# Patient Record
Sex: Male | Born: 1975 | Race: White | Hispanic: No | Marital: Single | Smoking: Current every day smoker
Health system: Southern US, Community
[De-identification: ages and names within clinical notes are randomized; demographics above are authoritative.]

## PROBLEM LIST (undated history)

## (undated) DIAGNOSIS — C3490 Malignant neoplasm of unspecified part of unspecified bronchus or lung: Secondary | ICD-10-CM

## (undated) DIAGNOSIS — F419 Anxiety disorder, unspecified: Secondary | ICD-10-CM

## (undated) DIAGNOSIS — N200 Calculus of kidney: Secondary | ICD-10-CM

## (undated) DIAGNOSIS — J449 Chronic obstructive pulmonary disease, unspecified: Secondary | ICD-10-CM

## (undated) HISTORY — PX: KNEE SURGERY: SHX244

## (undated) HISTORY — DX: Chronic obstructive pulmonary disease, unspecified: J44.9

## (undated) HISTORY — PX: ELBOW SURGERY: SHX618

---

## 2002-12-06 ENCOUNTER — Emergency Department (HOSPITAL_COMMUNITY): Admission: AD | Admit: 2002-12-06 | Discharge: 2002-12-06 | Payer: Self-pay | Admitting: Family Medicine

## 2004-02-24 ENCOUNTER — Emergency Department (HOSPITAL_COMMUNITY): Admission: EM | Admit: 2004-02-24 | Discharge: 2004-02-24 | Payer: Self-pay | Admitting: Emergency Medicine

## 2004-02-25 ENCOUNTER — Ambulatory Visit (HOSPITAL_COMMUNITY): Admission: RE | Admit: 2004-02-25 | Discharge: 2004-02-25 | Payer: Self-pay | Admitting: Emergency Medicine

## 2004-12-22 ENCOUNTER — Emergency Department (HOSPITAL_COMMUNITY): Admission: EM | Admit: 2004-12-22 | Discharge: 2004-12-22 | Payer: Self-pay | Admitting: Emergency Medicine

## 2005-09-22 ENCOUNTER — Encounter: Payer: Self-pay | Admitting: Orthopedic Surgery

## 2005-10-16 ENCOUNTER — Encounter: Payer: Self-pay | Admitting: Orthopedic Surgery

## 2005-11-15 ENCOUNTER — Encounter: Payer: Self-pay | Admitting: Orthopedic Surgery

## 2007-10-13 ENCOUNTER — Ambulatory Visit (HOSPITAL_COMMUNITY): Admission: RE | Admit: 2007-10-13 | Discharge: 2007-10-13 | Payer: Self-pay | Admitting: Family Medicine

## 2007-10-19 ENCOUNTER — Ambulatory Visit (HOSPITAL_COMMUNITY): Admission: RE | Admit: 2007-10-19 | Discharge: 2007-10-19 | Payer: Self-pay | Admitting: Family Medicine

## 2007-10-24 ENCOUNTER — Emergency Department (HOSPITAL_COMMUNITY): Admission: EM | Admit: 2007-10-24 | Discharge: 2007-10-24 | Payer: Self-pay | Admitting: Emergency Medicine

## 2007-11-01 ENCOUNTER — Ambulatory Visit: Payer: Self-pay | Admitting: Unknown Physician Specialty

## 2008-03-01 ENCOUNTER — Ambulatory Visit: Payer: Self-pay | Admitting: Unknown Physician Specialty

## 2008-03-06 ENCOUNTER — Ambulatory Visit: Payer: Self-pay | Admitting: Unknown Physician Specialty

## 2008-05-23 ENCOUNTER — Ambulatory Visit: Payer: Self-pay | Admitting: Unknown Physician Specialty

## 2009-04-29 ENCOUNTER — Ambulatory Visit: Payer: Self-pay | Admitting: Neurology

## 2009-11-11 ENCOUNTER — Ambulatory Visit: Payer: Self-pay | Admitting: Internal Medicine

## 2010-05-04 ENCOUNTER — Ambulatory Visit: Payer: Self-pay | Admitting: Internal Medicine

## 2010-10-16 ENCOUNTER — Ambulatory Visit: Payer: Self-pay | Admitting: Internal Medicine

## 2010-11-06 ENCOUNTER — Ambulatory Visit: Payer: Self-pay | Admitting: Internal Medicine

## 2011-07-01 ENCOUNTER — Emergency Department (HOSPITAL_COMMUNITY): Payer: Medicaid Other

## 2011-07-01 ENCOUNTER — Encounter (HOSPITAL_COMMUNITY): Payer: Self-pay | Admitting: Emergency Medicine

## 2011-07-01 ENCOUNTER — Emergency Department (HOSPITAL_COMMUNITY)
Admission: EM | Admit: 2011-07-01 | Discharge: 2011-07-01 | Disposition: A | Discharge status: Home / Self Care | Payer: Medicaid Other | Attending: Emergency Medicine | Admitting: Emergency Medicine

## 2011-07-01 DIAGNOSIS — R319 Hematuria, unspecified: Secondary | ICD-10-CM | POA: Insufficient documentation

## 2011-07-01 DIAGNOSIS — F17200 Nicotine dependence, unspecified, uncomplicated: Secondary | ICD-10-CM | POA: Insufficient documentation

## 2011-07-01 DIAGNOSIS — R109 Unspecified abdominal pain: Secondary | ICD-10-CM | POA: Insufficient documentation

## 2011-07-01 DIAGNOSIS — M545 Low back pain, unspecified: Secondary | ICD-10-CM | POA: Insufficient documentation

## 2011-07-01 HISTORY — DX: Anxiety disorder, unspecified: F41.9

## 2011-07-01 LAB — URINALYSIS, ROUTINE W REFLEX MICROSCOPIC
Bilirubin Urine: NEGATIVE
Ketones, ur: NEGATIVE mg/dL
Leukocytes, UA: NEGATIVE
Nitrite: NEGATIVE
Specific Gravity, Urine: 1.005 (ref 1.005–1.030)
Urobilinogen, UA: 0.2 mg/dL (ref 0.0–1.0)
pH: 6 (ref 5.0–8.0)

## 2011-07-01 MED ORDER — OXYCODONE-ACETAMINOPHEN 5-325 MG PO TABS: 1.0000 | ORAL_TABLET | ORAL | Status: AC | PRN

## 2011-07-01 MED ORDER — HYDROMORPHONE HCL PF 2 MG/ML IJ SOLN
2.0000 mg | Freq: Once | INTRAMUSCULAR | Status: AC
  Administered 2011-07-01: 2 mg via INTRAVENOUS
  Filled 2011-07-01: qty 1

## 2011-07-01 MED ORDER — CYCLOBENZAPRINE HCL 10 MG PO TABS: 10.0000 mg | ORAL_TABLET | Freq: Three times a day (TID) | ORAL | Status: AC | PRN

## 2011-07-01 MED ORDER — SODIUM CHLORIDE 0.9 % IV SOLN
INTRAVENOUS | Status: DC
  Administered 2011-07-01: 13:00:00 via INTRAVENOUS

## 2011-07-01 MED ORDER — ONDANSETRON HCL 4 MG/2ML IJ SOLN
4.0000 mg | Freq: Once | INTRAMUSCULAR | Status: AC
  Administered 2011-07-01: 4 mg via INTRAVENOUS
  Filled 2011-07-01: qty 2

## 2011-07-01 MED ORDER — DIPHENHYDRAMINE HCL 50 MG/ML IJ SOLN
25.0000 mg | Freq: Once | INTRAMUSCULAR | Status: AC
  Administered 2011-07-01: 25 mg via INTRAVENOUS
  Filled 2011-07-01: qty 1

## 2011-07-01 NOTE — ED Notes (Signed)
Severe pain to lower left abd and flank area. Sometimes more severe than others. Pt states passing some blood in urine a few times recently. No history of kidney stones.

## 2011-07-01 NOTE — ED Provider Notes (Cosign Needed)
History  This chart was scribed for Guy Cooper III, MD by Stevphen Meuse. This patient was seen in room APA03/APA03 and the patient's care was started at 12.35PM.  CSN: [id number]  Arrival date & time 07/01/11  1110   First MD Initiated Contact with Patient 07/01/11 1230      Chief Complaint  Patient presents with  . Flank Pain  . Hematuria    (Consider location/radiation/quality/duration/timing/severity/associated sxs/prior treatment) Patient is a 36 y.o. male presenting with flank pain and hematuria. The history is provided by the patient. No language interpreter was used.  Flank Pain The current episode started more than 2 days ago. The problem has been gradually worsening. Pertinent negatives include no chest pain, no headaches and no shortness of breath. He has tried nothing for the symptoms.  Hematuria Associated symptoms include flank pain. Pertinent negatives include no fever.   Guy Miller is a 36 y.o. male who presents to the Emergency Department complaining of approximately 1 week of gradual onset, gradually worsening severe flank pain radiating to his lower left abdomin. Pt states that he has had similar episodes but states that they normally go away on its own. Pt states that movement aggrevates his pain. Pt denies taking any medication for his current symptoms. Pt denies fever, neck pain, SOB, chest pain, diarrhea, rash and HA as associated symptoms. Pt reports hematuria as an associated symptom. Pt has a h/o anxiety and is a current everyday smoker but denies a h/o alcohol use.  Past Medical History  Diagnosis Date  . Anxiety     No past surgical history on file.  No family history on file.  History  Substance Use Topics  . Smoking status: Current Everyday Smoker -- 2.0 packs/day  . Smokeless tobacco: Not on file  . Alcohol Use: No      Review of Systems  Constitutional: Negative for fever.  HENT: Negative for neck pain.   Respiratory: Negative  for shortness of breath.   Cardiovascular: Negative for chest pain.  Gastrointestinal: Negative for diarrhea.  Genitourinary: Positive for hematuria and flank pain.  Skin: Negative for rash.  Neurological: Negative for headaches.    Allergies  Review of patient's allergies indicates no known allergies.  Home Medications  No current outpatient prescriptions on file.  Triage Vitals: BP 121/60  Pulse 79  Temp(Src) 97.7 F (36.5 C) (Oral)  Resp 20  Ht 5\' 9"  (1.753 m)  Wt 180 lb (81.647 kg)  BMI 26.58 kg/m2  SpO2 100%  Physical Exam  Nursing note and vitals reviewed. Constitutional: He is oriented to person, place, and time. He appears well-developed and well-nourished.  HENT:  Head: Normocephalic and atraumatic.  Eyes: Conjunctivae are normal.  Neck: Normal range of motion. Neck supple.  Cardiovascular: Normal rate, regular rhythm and normal heart sounds.   Pulmonary/Chest: Effort normal and breath sounds normal.  Abdominal: Soft. Bowel sounds are normal.  Musculoskeletal:       Left flank and left lower abdomen pain  Neurological: He is alert and oriented to person, place, and time.  Skin: Skin is warm and dry.  Psychiatric: He has a normal mood and affect. His behavior is normal.    ED Course  Procedures (including critical care time) DIAGNOSTIC STUDIES: Oxygen Saturation is 100% on room air, normal by my interpretation.    COORDINATION OF CARE: 12:41PM discussed a urine test and xray of body to check for kidney stones with pt and pt agreed     Labs Reviewed  URINALYSIS, ROUTINE W REFLEX MICROSCOPIC   3:01 PM Results for orders placed during the hospital encounter of 07/01/11  URINALYSIS, ROUTINE W REFLEX MICROSCOPIC      Component Value Range   Color, Urine YELLOW  YELLOW    APPearance CLEAR  CLEAR    Specific Gravity, Urine 1.005  1.005 - 1.030    pH 6.0  5.0 - 8.0    Glucose, UA NEGATIVE  NEGATIVE (mg/dL)   Hgb urine dipstick NEGATIVE  NEGATIVE     Bilirubin Urine NEGATIVE  NEGATIVE    Ketones, ur NEGATIVE  NEGATIVE (mg/dL)   Protein, ur NEGATIVE  NEGATIVE (mg/dL)   Urobilinogen, UA 0.2  0.0 - 1.0 (mg/dL)   Nitrite NEGATIVE  NEGATIVE    Leukocytes, UA NEGATIVE  NEGATIVE    Ct Abdomen Pelvis Wo Contrast  07/01/2011  *RADIOLOGY REPORT*  Clinical Data: Left flank pain and hematuria extending to left groin, suspected kidney stone  CT ABDOMEN AND PELVIS WITHOUT CONTRAST  Technique:  Multidetector CT imaging of the abdomen and pelvis was performed following the standard protocol without intravenous contrast. Sagittal and coronal MPR images reconstructed from axial data set.  Comparison: None  Findings: Minimal atelectasis lateral right lung base. No urinary tract calcification, hydronephrosis or hydroureter. Kidneys and bladder normal appearance. Tiny capsular calcification lateral margin of the liver, nonspecific. Within limits of a nonenhanced exam, no focal abnormalities of the liver, spleen, pancreas, kidneys, or adrenal glands otherwise identified. Normal appendix. Stomach and bowel loops normal appearance. No mass, adenopathy, free fluid inflammatory process. No acute osseous findings.  IMPRESSION: No acute intra abdominal or intrapelvic abnormalities.  Original Report Authenticated By: Lollie Marrow, M.D.   UA and CT of abdomen and pelvis were negative.  No specific cause for his back pain was found.  Rx as for back pain with Flexeril and Percocet.  F/U with his doctor at the [facility].      1. Low back pain    I personally performed the services described in this documentation, which was scribed in my presence. The recorded information has been reviewed and considered.  Osvaldo Human, M.D.     Guy Cooper III, MD 07/01/11 [phone]

## 2011-07-01 NOTE — Discharge Instructions (Signed)
Back Pain, Adult Low back pain is very common. About 1 in 5 people have back pain.The cause of low back pain is rarely dangerous. The pain often gets better over time.About half of people with a sudden onset of back pain feel better in just 2 weeks. About 8 in 10 people feel better by 6 weeks.  CAUSES Some common causes of back pain include:  Strain of the muscles or ligaments supporting the spine.   Wear and tear (degeneration) of the spinal discs.   Arthritis.   Direct injury to the back.  DIAGNOSIS Most of the time, the direct cause of low back pain is not known.However, back pain can be treated effectively even when the exact cause of the pain is unknown.Answering your caregiver's questions about your overall health and symptoms is one of the most accurate ways to make sure the cause of your pain is not dangerous. If your caregiver needs more information, he or she may order lab work or imaging tests (X-rays or MRIs).However, even if imaging tests show changes in your back, this usually does not require surgery. HOME CARE INSTRUCTIONS For many people, back pain returns.Since low back pain is rarely dangerous, it is often a condition that people can learn to manageon their own.   Remain active. It is stressful on the back to sit or stand in one place. Do not sit, drive, or stand in one place for more than 30 minutes at a time. Take short walks on level surfaces as soon as pain allows.Try to increase the length of time you walk each day.   Do not stay in bed.Resting more than 1 or 2 days can delay your recovery.   Do not avoid exercise or work.Your body is made to move.It is not dangerous to be active, even though your back may hurt.Your back will likely heal faster if you return to being active before your pain is gone.   Pay attention to your body when you bend and lift. Many people have less discomfortwhen lifting if they bend their knees, keep the load close to their  bodies,and avoid twisting. Often, the most comfortable positions are those that put less stress on your recovering back.   Find a comfortable position to sleep. Use a firm mattress and lie on your side with your knees slightly bent. If you lie on your back, put a pillow under your knees.   Only take over-the-counter or prescription medicines as directed by your caregiver. Over-the-counter medicines to reduce pain and inflammation are often the most helpful.Your caregiver may prescribe muscle relaxant drugs.These medicines help dull your pain so you can more quickly return to your normal activities and healthy exercise.   Put ice on the injured area.   Put ice in a plastic bag.   Place a towel between your skin and the bag.   Leave the ice on for 15 to 20 minutes, 3 to 4 times a day for the first 2 to 3 days. After that, ice and heat may be alternated to reduce pain and spasms.   Ask your caregiver about trying back exercises and gentle massage. This may be of some benefit.   Avoid feeling anxious or stressed.Stress increases muscle tension and can worsen back pain.It is important to recognize when you are anxious or stressed and learn ways to manage it.Exercise is a great option.  SEEK MEDICAL CARE IF:  You have pain that is not relieved with rest or medicine.   You have   pain that does not improve in 1 week.   You have new symptoms.   You are generally not feeling well.  SEEK IMMEDIATE MEDICAL CARE IF:   You have pain that radiates from your back into your legs.   You develop new bowel or bladder control problems.   You have unusual weakness or numbness in your arms or legs.   You develop nausea or vomiting.   You develop abdominal pain.   You feel faint.  Document Released: 02/01/2005 Document Revised: 01/21/2011 Document Reviewed: 06/22/2010 [employer] Patient Information 2012 [employer]. 

## 2012-03-24 ENCOUNTER — Other Ambulatory Visit: Payer: Self-pay | Admitting: Urology

## 2012-03-24 ENCOUNTER — Ambulatory Visit (INDEPENDENT_AMBULATORY_CARE_PROVIDER_SITE_OTHER): Payer: Medicaid Other | Admitting: Urology

## 2012-03-24 DIAGNOSIS — M545 Low back pain, unspecified: Secondary | ICD-10-CM

## 2012-03-24 DIAGNOSIS — R310 Gross hematuria: Secondary | ICD-10-CM

## 2012-03-24 DIAGNOSIS — R1032 Left lower quadrant pain: Secondary | ICD-10-CM

## 2012-03-27 ENCOUNTER — Ambulatory Visit (HOSPITAL_COMMUNITY): Payer: Medicaid Other

## 2012-03-30 ENCOUNTER — Ambulatory Visit (HOSPITAL_COMMUNITY): Payer: Medicaid Other

## 2012-04-07 ENCOUNTER — Ambulatory Visit: Payer: Medicaid Other | Admitting: Urology

## 2012-04-13 ENCOUNTER — Encounter: Payer: Self-pay | Admitting: Orthopedic Surgery

## 2012-04-13 ENCOUNTER — Ambulatory Visit (INDEPENDENT_AMBULATORY_CARE_PROVIDER_SITE_OTHER): Payer: Medicaid Other

## 2012-04-13 ENCOUNTER — Ambulatory Visit (INDEPENDENT_AMBULATORY_CARE_PROVIDER_SITE_OTHER): Payer: Medicaid Other | Admitting: Orthopedic Surgery

## 2012-04-13 VITALS — BP 102/80 | Ht 69.0 in | Wt 172.0 lb

## 2012-04-13 DIAGNOSIS — M24559 Contracture, unspecified hip: Secondary | ICD-10-CM

## 2012-04-13 DIAGNOSIS — M5126 Other intervertebral disc displacement, lumbar region: Secondary | ICD-10-CM

## 2012-04-13 DIAGNOSIS — M25569 Pain in unspecified knee: Secondary | ICD-10-CM

## 2012-04-13 DIAGNOSIS — Z9889 Other specified postprocedural states: Secondary | ICD-10-CM

## 2012-04-13 DIAGNOSIS — M24551 Contracture, right hip: Secondary | ICD-10-CM | POA: Insufficient documentation

## 2012-04-13 NOTE — Progress Notes (Signed)
Patient ID: Guy Miller, male   DOB: 03-27-1975, 37 y.o.   MRN: [id number] Chief Complaint  Patient presents with  . Knee Pain    Right knee pain.    Chief complaint pain in the right knee radiating into the right foot with numbness and tingling associated with back pain radiating from the right hip down into the right foot. Secondary complaint I can't turn my right leg and.  The patient had a right knee anterior cruciate ligament reconstruction with Endobutton, interference screw on the tibia and a screw and washer also on the tibia as backup fixation. He did well until 6 months ago when he started having back pain radiating down to his knee and foot associated plantar numbness.  In December his father was falling he went to catch him he externally rotated his foot and since that time has not been able to bring it back to neutral position.  He complains of sharp throbbing 10 out of 10 constant pain with numbness and tingling associated with swelling in his right knee  Review of systems chills wording of the shortness of breath wheezing cough tightness heartburn nausea vomiting numbness tingling nervousness anxiety excessive thirst he denies easy bleeding adverse reactions to food frequency urgency chest palpitations or chest pain  Past Medical History  Diagnosis Date  . Anxiety   . COPD (chronic obstructive pulmonary disease)   . Anxiety     BP 102/80  Ht 5\' 9"  (1.753 m)  Wt 172 lb (78.019 kg)  BMI 25.39 kg/m2 General appearance is normal, the patient is alert and oriented x3 with normal mood and affect. He is ambulating with his right foot turned out however when he's not really thinking about it or when he knows that no one smoking it actually seems to turn back and  He says it comes in to turn it back in  As far as his upper extremities go normal appearance, no contracture, no instability, normal muscle tone, no skin abnormalities  Left knee full range of motion all  ligaments are stable strength is normal skin is intact no swelling normal hip range of motion.  Right lower extremity is held in external rotation internal rotation causes pain the knee is slightly flexed as well but the anterior cruciate ligament is intact the joint is swollen muscle tone is normal skin is intact  Pulses are normal distally. No lymphadenopathy is noted to palpation. Sensation is normal. 2+ knee reflexes 1+ ankle jerk. Coordination and balance normal as he mainly walked around with his foot externally rotated hopping indicating good balance  X-ray of his knee shows intact screw which she felt might of been broken. Screw is intact anterior cruciate ligament graft is intact clinically. Tunnel sites look good. An old MRI was also reviewed and an old x-ray from 2010 and it shows excellent graft position and incorporation  Knee pain, right - Plan: DG Knee AP/LAT W/Sunrise Right  Contracture of right hip - Plan: Ambulatory referral to Physical Therapy  S/P ACL reconstruction  Lumbar disc disease with radiculopathy  His mom tells me that he had an MRI ordered of his back and it was declined by Medicaid  I think he needs an MRI of his back this would explain his symptoms ruptured disc or herniated disc.  At this point I think physical therapy is all that can be done for his external rotation contracture at the hip so we have ordered that  No followup scheduled.

## 2012-04-13 NOTE — Patient Instructions (Addendum)
Start therapy for right hip   If the mri of his back is done call us and get a copy for me to look at

## 2012-04-28 ENCOUNTER — Emergency Department (HOSPITAL_COMMUNITY): Payer: Medicaid Other

## 2012-04-28 ENCOUNTER — Emergency Department (HOSPITAL_COMMUNITY)
Admission: EM | Admit: 2012-04-28 | Discharge: 2012-04-28 | Disposition: A | Discharge status: Home / Self Care | Payer: Medicaid Other | Attending: Emergency Medicine | Admitting: Emergency Medicine

## 2012-04-28 ENCOUNTER — Encounter (HOSPITAL_COMMUNITY): Payer: Self-pay | Admitting: *Deleted

## 2012-04-28 DIAGNOSIS — M5137 Other intervertebral disc degeneration, lumbosacral region: Secondary | ICD-10-CM | POA: Insufficient documentation

## 2012-04-28 DIAGNOSIS — R55 Syncope and collapse: Secondary | ICD-10-CM | POA: Insufficient documentation

## 2012-04-28 DIAGNOSIS — G8929 Other chronic pain: Secondary | ICD-10-CM | POA: Insufficient documentation

## 2012-04-28 DIAGNOSIS — R5381 Other malaise: Secondary | ICD-10-CM | POA: Insufficient documentation

## 2012-04-28 DIAGNOSIS — Z8659 Personal history of other mental and behavioral disorders: Secondary | ICD-10-CM | POA: Insufficient documentation

## 2012-04-28 DIAGNOSIS — Z79899 Other long term (current) drug therapy: Secondary | ICD-10-CM | POA: Insufficient documentation

## 2012-04-28 DIAGNOSIS — Y9301 Activity, walking, marching and hiking: Secondary | ICD-10-CM | POA: Insufficient documentation

## 2012-04-28 DIAGNOSIS — Y92009 Unspecified place in unspecified non-institutional (private) residence as the place of occurrence of the external cause: Secondary | ICD-10-CM | POA: Insufficient documentation

## 2012-04-28 DIAGNOSIS — M51379 Other intervertebral disc degeneration, lumbosacral region without mention of lumbar back pain or lower extremity pain: Secondary | ICD-10-CM | POA: Insufficient documentation

## 2012-04-28 DIAGNOSIS — W19XXXA Unspecified fall, initial encounter: Secondary | ICD-10-CM | POA: Insufficient documentation

## 2012-04-28 DIAGNOSIS — IMO0002 Reserved for concepts with insufficient information to code with codable children: Secondary | ICD-10-CM | POA: Insufficient documentation

## 2012-04-28 DIAGNOSIS — F17200 Nicotine dependence, unspecified, uncomplicated: Secondary | ICD-10-CM | POA: Insufficient documentation

## 2012-04-28 DIAGNOSIS — J4489 Other specified chronic obstructive pulmonary disease: Secondary | ICD-10-CM | POA: Insufficient documentation

## 2012-04-28 MED ORDER — HYDROMORPHONE HCL 4 MG PO TABS: 4.0000 mg | ORAL_TABLET | ORAL | Status: DC | PRN

## 2012-04-28 MED ORDER — FENTANYL CITRATE 0.05 MG/ML IJ SOLN
100.0000 ug | Freq: Once | INTRAMUSCULAR | Status: AC
  Administered 2012-04-28: 100 ug via NASAL
  Filled 2012-04-28: qty 2

## 2012-04-28 MED ORDER — KETOROLAC TROMETHAMINE 60 MG/2ML IM SOLN: 30.0000 mg | Freq: Once | INTRAMUSCULAR | Status: DC

## 2012-04-28 MED ORDER — HYDROMORPHONE HCL PF 2 MG/ML IJ SOLN
2.0000 mg | Freq: Once | INTRAMUSCULAR | Status: AC
  Administered 2012-04-28: 2 mg via INTRAMUSCULAR
  Filled 2012-04-28: qty 1

## 2012-04-28 NOTE — ED Notes (Addendum)
Pt states his back gave out while walking in the house this morning. Ongoing back trouble x 2 months. Pt states he fell and now having pain to right leg. Pt states he is unable to walk. States he had to crawl to the car for his mother to bring him here.

## 2012-04-28 NOTE — ED Notes (Signed)
Patient refused crutches.

## 2012-04-28 NOTE — ED Notes (Signed)
Pain in lower back, states he fell this am

## 2012-04-28 NOTE — ED Provider Notes (Signed)
History  This chart was scribed for Hurman Horn, MD by Erskine Emery, ED Scribe. This patient was seen in room APAH7/APAH7 and the patient's care was started at 16:52.   CSN: [id number]  Arrival date & time 04/28/12  1300   First MD Initiated Contact with Patient 04/28/12 1652      Chief Complaint  Patient presents with  . Fall  . Tailbone Pain    (Consider location/radiation/quality/duration/timing/severity/associated sxs/prior treatment) The history is provided by the patient. No language interpreter was used.  Guy Miller is a 37 y.o. male who presents to the Emergency Department complaining of aggravated back pain since a fall on to his back this morning after his legs gave out. Pt did not hit his head and had no LOC. Pt reports the pain and weakness is so bad he had to crawl to the car. Pt has had chronic back pain for several months that radiates down his right leg with associated right leg weakness and numbness. He denies any bowel or bladder incontinence, abdominal pain, emesis, nausea, fever, h/o IV drug use, h/o cancer, or h/o DM. He is normally perfectly healthy. Pt hasn't been taking anything for his pain, not even tylenol or ibuprofen. Pt was scheduled to get a MRI in February but was denied because of his insurance.  Dr. Romeo Apple has been following the pt for his back pain. [facility] is the pt's [facility].  Past Medical History  Diagnosis Date  . Anxiety   . COPD (chronic obstructive pulmonary disease)   . Anxiety     Past Surgical History  Procedure Laterality Date  . Knee surgery    . Elbow surgery      Family History  Problem Relation Age of Onset  . Arthritis    . Diabetes      History  Substance Use Topics  . Smoking status: Current Every Day Smoker -- 2.00 packs/day  . Smokeless tobacco: Not on file  . Alcohol Use: No      Review of Systems 10 Systems reviewed and are negative for acute change except as noted in the  HPI.   Allergies  Gabapentin; Penicillins; Tramadol; Trazodone and nefazodone; Buspar; Demerol; Ibuprofen; Prednisone; and Tylenol  Home Medications   Current Outpatient Rx  Name  Route  Sig  Dispense  Refill  . albuterol (VENTOLIN HFA) 108 (90 BASE) MCG/ACT inhaler   Inhalation   Inhale 2 puffs into the lungs every 6 (six) hours as needed for wheezing.         . Fluticasone-Salmeterol (ADVAIR DISKUS) 250-50 MCG/DOSE AEPB   Inhalation   Inhale 1 puff into the lungs daily.          Marland Kitchen HYDROmorphone (DILAUDID) 4 MG tablet   Oral   Take 1 tablet (4 mg total) by mouth every 4 (four) hours as needed for pain.   15 tablet   0     Triage Vitals: BP 113/84  Pulse 85  Temp(Src) 97.3 F (36.3 C) (Oral)  Resp 18  Ht 5\' 9"  (1.753 m)  Wt 182 lb (82.555 kg)  BMI 26.86 kg/m2  SpO2 100%  Physical Exam  Nursing note and vitals reviewed. Constitutional:  Awake, alert, nontoxic appearance with baseline speech.  HENT:  Head: Atraumatic.  Eyes: Pupils are equal, round, and reactive to light. Right eye exhibits no discharge. Left eye exhibits no discharge.  Neck: Neck supple.  Cardiovascular: Normal rate and regular rhythm.   No murmur heard. Pulmonary/Chest:  Effort normal and breath sounds normal. No respiratory distress. He has no wheezes. He has no rales. He exhibits no tenderness.  Abdominal: Soft. Bowel sounds are normal. He exhibits no mass. There is no tenderness. There is no rebound.  Musculoskeletal:       Thoracic back: He exhibits no tenderness.       Lumbar back: He exhibits no tenderness.  Bilateral lower extremities non tender without new rashes or color change, baseline ROM with intact DP pulses, CR<2 secs all digits bilaterally, DTR's symmetric and intact bilaterally KJ / AJ, motor symmetric bilateral 5 / 5 hip flexion, quadriceps, hamstrings, EHL, foot dorsiflexion, foot plantarflexion. C-spine and T-spine nontender. Tenderness to lower lumbar and bilateral  sacro-iliac. Right more tender than left. Entire right leg has decreased light touch.  Neurological:  Mental status baseline for patient.  Upper extremity motor strength and sensation intact and symmetric bilaterally.  Skin: No rash noted.  Psychiatric: He has a normal mood and affect.    ED Course  Procedures (including critical care time) DIAGNOSTIC STUDIES: Oxygen Saturation is 100% on room air, normal by my interpretation.    COORDINATION OF CARE: 17:05--Patient / Family / Caregiver informed of clinical course, understand medical decision-making process, and agree with plan. Treatment plan: pain medication injection, crutches, and MRI.  19:18--Pt stable in ED with no significant deterioration in condition. I explained to the pt the results of his CT. Pt is ready for discharge.   Labs Reviewed - No data to display Dg Lumbar Spine Complete  04/28/2012  *RADIOLOGY REPORT*  Clinical Data: Right side low back pain.  LUMBAR SPINE - COMPLETE 4+ VIEW  Comparison: CT abdomen and pelvis 07/01/2011.  Findings: Vertebral body height and alignment are normal. Intervertebral disc space height is maintained.  No pars interarticularis defect is identified.  Paraspinous structures appear normal.  IMPRESSION: Normal study.   Original Report Authenticated By: Holley Dexter, M.D.    Mr Lumbar Spine Wo Contrast  04/28/2012  *RADIOLOGY REPORT*  Clinical Data: Numbness and weakness of the right leg.  MRI LUMBAR SPINE WITHOUT CONTRAST  Technique:  Multiplanar and multiecho pulse sequences of the lumbar spine were obtained without intravenous contrast.  Comparison: Radiography same day.  CT abdomen 07/01/2011.  Findings: There is no abnormality at L3-4 or above.  The discs are normal in signal characteristics and morphology.  The spinal canal and foramina are widely patent.  The distal cord and conus are normal with conus tip at L1.  L4-5:  Desiccation and mild bulging of the disc.  Small annular rent and disc  protrusion in the right foraminal to extraforaminal region that could focally irritate the right L4 nerve root.  Mild facet degeneration and hypertrophy.  Mild narrowing of both lateral recesses.  L5-S1:  Disc desiccation with annular tearing and a shallow broad- based disc herniation.  This contacts the S1 nerve roots as they bud from the thecal sac but does not appear to cause focal neural compression.  IMPRESSION: L4-5:  Desiccation and bulging of the disc.  Focal protrusion in the right foraminal to extraforaminal region that could focally irritate the right L4 nerve root.  Bilateral facet degeneration hypertrophy at this level with mild stenosis of both lateral recesses.  L5-S1:  Desiccation and shallow broad-based herniation.  Disc material contacts the S1 nerve roots, somewhat more on the right. This could cause neural irritation, though distinct S1 compression is not demonstrated.   Original Report Authenticated By: Paulina Fusi, M.D.  1. Lumbar disc disease with radiculopathy       MDM  I doubt any other EMC precluding discharge at this time including, but not necessarily limited to the following:SBI, cauda equina syndrome.  I personally performed the services described in this documentation, which was scribed in my presence. The recorded information has been reviewed and is accurate.     Hurman Horn, MD 04/29/12 1255

## 2012-05-01 ENCOUNTER — Telehealth: Payer: Self-pay | Admitting: Orthopedic Surgery

## 2012-05-01 NOTE — Telephone Encounter (Signed)
Patient called, then Mom spoke on patient's behalf; states Guy Miller had a fall and was seen at [facility] 04/28/12, and had Xrays + MRI. States MRI showed 2 bulging discs.  Aware that follow up with [facility] is needed in order for any further referrals to be done.  Aware that Dr. Romeo Apple does not treat this issue.  Mom asked to relay to Dr. Romeo Apple that [name] has been unable to get to physical therapy as per office visit here 04/13/12; said now will be holding due to new medical issue.

## 2013-10-08 ENCOUNTER — Emergency Department (HOSPITAL_COMMUNITY): Payer: Medicaid Other

## 2013-10-08 ENCOUNTER — Emergency Department (HOSPITAL_COMMUNITY)
Admission: EM | Admit: 2013-10-08 | Discharge: 2013-10-08 | Disposition: A | Discharge status: Home / Self Care | Payer: Medicaid Other | Attending: Emergency Medicine | Admitting: Emergency Medicine

## 2013-10-08 ENCOUNTER — Encounter (HOSPITAL_COMMUNITY): Payer: Self-pay | Admitting: Emergency Medicine

## 2013-10-08 DIAGNOSIS — Z8659 Personal history of other mental and behavioral disorders: Secondary | ICD-10-CM | POA: Insufficient documentation

## 2013-10-08 DIAGNOSIS — J441 Chronic obstructive pulmonary disease with (acute) exacerbation: Secondary | ICD-10-CM | POA: Diagnosis not present

## 2013-10-08 DIAGNOSIS — Z880 Allergy status to penicillin: Secondary | ICD-10-CM | POA: Diagnosis not present

## 2013-10-08 DIAGNOSIS — S6992XA Unspecified injury of left wrist, hand and finger(s), initial encounter: Secondary | ICD-10-CM

## 2013-10-08 DIAGNOSIS — Y929 Unspecified place or not applicable: Secondary | ICD-10-CM | POA: Diagnosis not present

## 2013-10-08 DIAGNOSIS — W230XXA Caught, crushed, jammed, or pinched between moving objects, initial encounter: Secondary | ICD-10-CM | POA: Insufficient documentation

## 2013-10-08 DIAGNOSIS — M549 Dorsalgia, unspecified: Secondary | ICD-10-CM | POA: Diagnosis not present

## 2013-10-08 DIAGNOSIS — Y9389 Activity, other specified: Secondary | ICD-10-CM | POA: Insufficient documentation

## 2013-10-08 DIAGNOSIS — F411 Generalized anxiety disorder: Secondary | ICD-10-CM | POA: Insufficient documentation

## 2013-10-08 DIAGNOSIS — F17200 Nicotine dependence, unspecified, uncomplicated: Secondary | ICD-10-CM | POA: Insufficient documentation

## 2013-10-08 DIAGNOSIS — J4489 Other specified chronic obstructive pulmonary disease: Secondary | ICD-10-CM | POA: Insufficient documentation

## 2013-10-08 DIAGNOSIS — IMO0002 Reserved for concepts with insufficient information to code with codable children: Secondary | ICD-10-CM | POA: Insufficient documentation

## 2013-10-08 DIAGNOSIS — S6990XA Unspecified injury of unspecified wrist, hand and finger(s), initial encounter: Secondary | ICD-10-CM

## 2013-10-08 DIAGNOSIS — S59909A Unspecified injury of unspecified elbow, initial encounter: Secondary | ICD-10-CM | POA: Diagnosis not present

## 2013-10-08 DIAGNOSIS — J449 Chronic obstructive pulmonary disease, unspecified: Secondary | ICD-10-CM | POA: Insufficient documentation

## 2013-10-08 DIAGNOSIS — Z23 Encounter for immunization: Secondary | ICD-10-CM | POA: Diagnosis not present

## 2013-10-08 DIAGNOSIS — S59919A Unspecified injury of unspecified forearm, initial encounter: Secondary | ICD-10-CM

## 2013-10-08 MED ORDER — TETANUS-DIPHTH-ACELL PERTUSSIS 5-2.5-18.5 LF-MCG/0.5 IM SUSP
0.5000 mL | Freq: Once | INTRAMUSCULAR | Status: AC
  Administered 2013-10-08: 0.5 mL via INTRAMUSCULAR
  Filled 2013-10-08: qty 0.5

## 2013-10-08 MED ORDER — HYDROCODONE-ACETAMINOPHEN 5-325 MG PO TABS
1.0000 | ORAL_TABLET | Freq: Once | ORAL | Status: AC
  Administered 2013-10-08: 1 via ORAL
  Filled 2013-10-08: qty 1

## 2013-10-08 MED ORDER — HYDROCODONE-ACETAMINOPHEN 5-325 MG PO TABS: 1.0000 | ORAL_TABLET | Freq: Four times a day (QID) | ORAL | Status: DC | PRN

## 2013-10-08 NOTE — Discharge Instructions (Signed)
X-rays without evidence of bony injury. Use Velcro splint as needed. If not improving in a few days make appointment to followup with Dr. Luna Glasgow who he had seen before. Worse the wound on the back of the wrist daily with soap and water and apply back straight some type of ointment. Return for any newer worse symptoms.  Elevate the left arm as much as possible for the next 2 days.

## 2013-10-08 NOTE — ED Notes (Addendum)
Pt had left wrist get caught between tractor and farm equipment, unable to feel anything to left ring and pinky finger and numbness to index, middle and thumb of hand, unable to bend wrist, per pt not able to lift hand from wrist

## 2013-10-08 NOTE — ED Provider Notes (Addendum)
CSN: [id number]     Arrival date & time 10/08/13  1421 History  This chart was scribed for Fredia Sorrow, MD by Lowella Petties, ED Scribe. The patient was seen in room APA09/APA09. Patient's care was started at 3:56 PM.   Chief Complaint  Patient presents with  . Wrist Pain   Patient is a 38 y.o. male presenting with wrist pain. The history is provided by the patient. No language interpreter was used.  Wrist Pain This is a new problem. The current episode started 3 to 5 hours ago. The problem occurs constantly. The problem has not changed since onset.Pertinent negatives include no chest pain, no abdominal pain, no headaches and no shortness of breath. The symptoms are aggravated by bending and twisting. Nothing relieves the symptoms. He has tried nothing for the symptoms.  HPI Comments: Guy Miller is a 38 y.o. male who presents to the Emergency Department complaining that his left hand got crushed when he was hooking up a piece of a tractor onset 3 hours ago. He reports sharp, constant, left wrist pain and numbness from the wrist down the ulnar side of the hand into his left pinkie finger. He reports that the pain is a 10/10.  He denies other injury. He reports that his tetanus is not UTD. He reports that he can take hydrocodone without issue.   JIR:CVELFYBOF, KATHY, FNP   Past Medical History  Diagnosis Date  . Anxiety   . COPD (chronic obstructive pulmonary disease)   . Anxiety    Past Surgical History  Procedure Laterality Date  . Knee surgery    . Elbow surgery     Family History  Problem Relation Age of Onset  . Arthritis    . Diabetes     History  Substance Use Topics  . Smoking status: Current Every Day Smoker -- 2.00 packs/day  . Smokeless tobacco: Not on file  . Alcohol Use: No    Review of Systems  Constitutional: Negative for fever and chills.  HENT: Negative for rhinorrhea and sore throat.   Eyes: Negative for visual disturbance.  Respiratory:  Negative for cough and shortness of breath.   Cardiovascular: Negative for chest pain and leg swelling.  Gastrointestinal: Negative for nausea, vomiting, abdominal pain and diarrhea.  Genitourinary: Negative for dysuria.  Musculoskeletal: Positive for arthralgias (left wrist) and back pain. Negative for neck pain.  Skin: Negative for rash.  Neurological: Positive for numbness (ulnar side of left wrist into pinkie finger). Negative for dizziness, light-headedness and headaches.  Hematological: Does not bruise/bleed easily.  Psychiatric/Behavioral: Negative for confusion.    Allergies  Gabapentin; Penicillins; Tramadol; Trazodone and nefazodone; Buspar; Demerol; Ibuprofen; Prednisone; and Tylenol  Home Medications   Prior to Admission medications   Medication Sig Start Date End Date Taking? Authorizing Provider  albuterol (VENTOLIN HFA) 108 (90 BASE) MCG/ACT inhaler Inhale 2 puffs into the lungs every 6 (six) hours as needed for wheezing.    Historical Provider, MD  Fluticasone-Salmeterol (ADVAIR DISKUS) 250-50 MCG/DOSE AEPB Inhale 1 puff into the lungs daily.     Historical Provider, MD  HYDROmorphone (DILAUDID) 4 MG tablet Take 1 tablet (4 mg total) by mouth every 4 (four) hours as needed for pain. 04/28/12   Babette Relic, MD   Triage Vitals: BP 128/91  Pulse 76  Temp(Src) 98.4 F (36.9 C) (Oral)  Resp 18  Ht 5\' 9"  (1.753 m)  Wt 182 lb (82.555 kg)  BMI 26.86 kg/m2  SpO2 96% Physical  Exam  Nursing note and vitals reviewed. Constitutional: He is oriented to person, place, and time. He appears well-developed and well-nourished. No distress.  HENT:  Head: Normocephalic and atraumatic.  Eyes: Conjunctivae and EOM are normal.  Neck: Neck supple. No tracheal deviation present.  Cardiovascular: Normal rate, regular rhythm and normal heart sounds.   No murmur heard. Pulmonary/Chest: Effort normal. No respiratory distress. He has wheezes (faint, bilateral).  Abdominal: Soft. Bowel  sounds are normal. There is no tenderness.  Musculoskeletal: Normal range of motion.  Radial pulse 2+ int he left wrist. 62mm laceration not bleeding. Superficial laceration on the ulner side of wrist not bleeding. Good Capillary refill. Sensation in tact to all fingers, decreased on the little finger.  Neurological: He is alert and oriented to person, place, and time. No cranial nerve deficit. He exhibits normal muscle tone. Coordination normal.  Skin: Skin is warm and dry.  Psychiatric: He has a normal mood and affect. His behavior is normal.    ED Course  Procedures (including critical care time) DIAGNOSTIC STUDIES: Oxygen Saturation is 96% on room air, normal by my interpretation.    COORDINATION OF CARE: 4:03 PM-Discussed treatment plan which includes wrist splint and pain medication with pt at bedside and pt agreed to plan.   Labs Review Labs Reviewed - No data to display  Imaging Review Dg Wrist Complete Left  10/08/2013   CLINICAL DATA:  Injured wrist.  EXAM: LEFT WRIST - COMPLETE 3+ VIEW  COMPARISON:  None.  FINDINGS: The joint spaces are maintained.  No acute fracture.  IMPRESSION: No acute bony findings.   Electronically Signed   By: Kalman Jewels M.D.   On: 10/08/2013 14:51     EKG Interpretation None      MDM   Final diagnoses:  None    And Marlowe Sax wound to the back of the wrist daily with soap and water apply packs a trace ointment. Use Velcro splint for comfort and elevate the left arm is much as possible. If not improving over a few days followup with Dr. Luna Glasgow who you have seen before. Return for any newer worse symptoms. Take pain medication as directed.  X-rays negative for any evidence of any bony injury. Patient has some numbness to the little finger ulnar distribution but sensation otherwise is intact. Followup with this with orthopedics if not improved would be important. No evidence of any compartment syndrome no significant swelling. Reasonable range of  motion at the wrist and fingers. The laceration on the back of the hand the dorsum of the hand is superficial and does not require any suturing. There appears to be no obvious tendon injuries.  I personally performed the services described in this documentation, which was scribed in my presence. The recorded information has been reviewed and is accurate.      Fredia Sorrow, MD 10/08/13 [place], MD 10/08/13 [phone]

## 2015-03-07 ENCOUNTER — Other Ambulatory Visit: Payer: Self-pay | Admitting: Neurology

## 2015-03-07 DIAGNOSIS — R208 Other disturbances of skin sensation: Secondary | ICD-10-CM

## 2015-04-02 ENCOUNTER — Ambulatory Visit
Admission: RE | Admit: 2015-04-02 | Discharge: 2015-04-02 | Disposition: A | Discharge status: Home / Self Care | Payer: Medicaid Other | Source: Ambulatory Visit | Attending: Neurology | Admitting: Neurology

## 2015-04-02 DIAGNOSIS — M50323 Other cervical disc degeneration at C6-C7 level: Secondary | ICD-10-CM | POA: Diagnosis not present

## 2015-04-02 DIAGNOSIS — M79601 Pain in right arm: Secondary | ICD-10-CM | POA: Diagnosis present

## 2015-04-02 DIAGNOSIS — R208 Other disturbances of skin sensation: Secondary | ICD-10-CM

## 2015-04-02 DIAGNOSIS — M50321 Other cervical disc degeneration at C4-C5 level: Secondary | ICD-10-CM | POA: Insufficient documentation

## 2015-04-02 DIAGNOSIS — R200 Anesthesia of skin: Secondary | ICD-10-CM | POA: Insufficient documentation

## 2015-04-02 DIAGNOSIS — M542 Cervicalgia: Secondary | ICD-10-CM | POA: Insufficient documentation

## 2015-05-14 ENCOUNTER — Encounter (HOSPITAL_COMMUNITY): Payer: Self-pay | Admitting: *Deleted

## 2015-05-14 ENCOUNTER — Emergency Department (HOSPITAL_COMMUNITY): Payer: Medicaid Other

## 2015-05-14 ENCOUNTER — Emergency Department (HOSPITAL_COMMUNITY)
Admission: EM | Admit: 2015-05-14 | Discharge: 2015-05-14 | Disposition: A | Discharge status: Home / Self Care | Payer: Medicaid Other | Attending: Emergency Medicine | Admitting: Emergency Medicine

## 2015-05-14 DIAGNOSIS — J441 Chronic obstructive pulmonary disease with (acute) exacerbation: Secondary | ICD-10-CM

## 2015-05-14 DIAGNOSIS — F17200 Nicotine dependence, unspecified, uncomplicated: Secondary | ICD-10-CM | POA: Diagnosis not present

## 2015-05-14 DIAGNOSIS — R748 Abnormal levels of other serum enzymes: Secondary | ICD-10-CM | POA: Diagnosis not present

## 2015-05-14 DIAGNOSIS — R0602 Shortness of breath: Secondary | ICD-10-CM | POA: Diagnosis present

## 2015-05-14 LAB — CBC WITH DIFFERENTIAL/PLATELET
BASOS PCT: 0 %
Basophils Absolute: 0 10*3/uL (ref 0.0–0.1)
EOS ABS: 0 10*3/uL (ref 0.0–0.7)
Eosinophils Relative: 0 %
HCT: 43.9 % (ref 39.0–52.0)
Hemoglobin: 14.6 g/dL (ref 13.0–17.0)
Lymphocytes Relative: 20 %
Lymphs Abs: 3 10*3/uL (ref 0.7–4.0)
MCH: 29.3 pg (ref 26.0–34.0)
MCHC: 33.3 g/dL (ref 30.0–36.0)
MCV: 88 fL (ref 78.0–100.0)
MONO ABS: 1.2 10*3/uL — AB (ref 0.1–1.0)
MONOS PCT: 8 %
Neutro Abs: 10.8 10*3/uL — ABNORMAL HIGH (ref 1.7–7.7)
Neutrophils Relative %: 72 %
Platelets: 237 10*3/uL (ref 150–400)
RBC: 4.99 MIL/uL (ref 4.22–5.81)
RDW: 13.2 % (ref 11.5–15.5)
WBC: 15 10*3/uL — ABNORMAL HIGH (ref 4.0–10.5)

## 2015-05-14 LAB — COMPREHENSIVE METABOLIC PANEL
ALBUMIN: 4.4 g/dL (ref 3.5–5.0)
ALK PHOS: 64 U/L (ref 38–126)
ALT: 114 U/L — ABNORMAL HIGH (ref 17–63)
ANION GAP: 9 (ref 5–15)
AST: 89 U/L — ABNORMAL HIGH (ref 15–41)
BILIRUBIN TOTAL: 0.4 mg/dL (ref 0.3–1.2)
BUN: 16 mg/dL (ref 6–20)
CALCIUM: 9 mg/dL (ref 8.9–10.3)
CO2: 27 mmol/L (ref 22–32)
Chloride: 106 mmol/L (ref 101–111)
Creatinine, Ser: 0.9 mg/dL (ref 0.61–1.24)
GFR calc non Af Amer: >60 mL/min (ref >60)
GLUCOSE: 90 mg/dL (ref 65–99)
POTASSIUM: 3.7 mmol/L (ref 3.5–5.1)
SODIUM: 142 mmol/L (ref 135–145)
Total Protein: 7.6 g/dL (ref 6.5–8.1)

## 2015-05-14 LAB — TROPONIN I

## 2015-05-14 MED ORDER — SODIUM CHLORIDE 0.9 % IV BOLUS (SEPSIS)
1000.0000 mL | Freq: Once | INTRAVENOUS | Status: AC
  Administered 2015-05-14: 1000 mL via INTRAVENOUS

## 2015-05-14 MED ORDER — ALBUTEROL SULFATE (2.5 MG/3ML) 0.083% IN NEBU
2.5000 mg | INHALATION_SOLUTION | Freq: Once | RESPIRATORY_TRACT | Status: AC
  Administered 2015-05-14: 2.5 mg via RESPIRATORY_TRACT
  Filled 2015-05-14: qty 3

## 2015-05-14 MED ORDER — METHYLPREDNISOLONE SODIUM SUCC 125 MG IJ SOLR
125.0000 mg | Freq: Once | INTRAMUSCULAR | Status: AC
  Administered 2015-05-14: 125 mg via INTRAVENOUS
  Filled 2015-05-14: qty 2

## 2015-05-14 MED ORDER — ALBUTEROL SULFATE (2.5 MG/3ML) 0.083% IN NEBU: 2.5000 mg | INHALATION_SOLUTION | Freq: Four times a day (QID) | RESPIRATORY_TRACT | Status: AC | PRN

## 2015-05-14 MED ORDER — IPRATROPIUM-ALBUTEROL 0.5-2.5 (3) MG/3ML IN SOLN
3.0000 mL | Freq: Once | RESPIRATORY_TRACT | Status: AC
  Administered 2015-05-14: 3 mL via RESPIRATORY_TRACT
  Filled 2015-05-14: qty 3

## 2015-05-14 NOTE — ED Notes (Signed)
Took out IV as per nurse

## 2015-05-14 NOTE — Discharge Instructions (Signed)
Prescription for nebulizer machine and nebulizer solution. Recommend follow-up with lung specialist. Phone number given. Continue to stop smoking. Continue all your home medications. Liver tests were elevated as follows: AST 89, ALT 114. These will need to be rechecked in 3-4 weeks.

## 2015-05-14 NOTE — ED Notes (Addendum)
Pt sent over by [facility] primary care to be evaluated. Went to doctor Monday for cough, was given steroidsand antibiotics. Went back today for a follow up and NP felt patient has not improved and sent him her to be checked out. Denies n/v/d.  Given breathing treatment at PCP office.
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
## 2015-07-21 ENCOUNTER — Other Ambulatory Visit (HOSPITAL_COMMUNITY): Payer: Self-pay | Admitting: Respiratory Therapy

## 2015-07-21 DIAGNOSIS — R0602 Shortness of breath: Secondary | ICD-10-CM

## 2015-08-05 ENCOUNTER — Other Ambulatory Visit (HOSPITAL_COMMUNITY): Payer: Self-pay | Admitting: Pulmonary Disease

## 2015-08-05 DIAGNOSIS — R079 Chest pain, unspecified: Secondary | ICD-10-CM

## 2015-08-05 DIAGNOSIS — R0602 Shortness of breath: Secondary | ICD-10-CM

## 2015-08-06 ENCOUNTER — Ambulatory Visit (HOSPITAL_COMMUNITY)
Admission: RE | Admit: 2015-08-06 | Discharge: 2015-08-06 | Disposition: A | Discharge status: Home / Self Care | Payer: Medicaid Other | Source: Ambulatory Visit | Attending: Pulmonary Disease | Admitting: Pulmonary Disease

## 2015-08-06 DIAGNOSIS — R0602 Shortness of breath: Secondary | ICD-10-CM | POA: Diagnosis not present

## 2015-08-06 LAB — PULMONARY FUNCTION TEST
DL/VA % PRED: 90 %
DL/VA: 4.15 ml/min/mmHg/L
DLCO unc % pred: 86 %
DLCO unc: 26.71 ml/min/mmHg
FEF 25-75 POST: 3.04 L/s
FEF 25-75 Pre: 1.37 L/sec
FEF2575-%CHANGE-POST: 122 %
FEF2575-%PRED-PRE: 35 %
FEF2575-%Pred-Post: 79 %
FEV1-%Change-Post: 36 %
FEV1-%Pred-Post: 81 %
FEV1-%Pred-Pre: 59 %
FEV1-PRE: 2.43 L
FEV1-Post: 3.32 L
FEV1FVC-%CHANGE-POST: 16 %
FEV1FVC-%PRED-PRE: 72 %
FEV6-%Change-Post: 20 %
FEV6-%Pred-Post: 97 %
FEV6-%Pred-Pre: 81 %
FEV6-Post: 4.86 L
FEV6-Pre: 4.05 L
FEV6FVC-%Change-Post: 2 %
FEV6FVC-%Pred-Post: 101 %
FEV6FVC-%Pred-Pre: 99 %
FVC-%CHANGE-POST: 17 %
FVC-%PRED-PRE: 81 %
FVC-%Pred-Post: 95 %
FVC-POST: 4.89 L
FVC-PRE: 4.16 L
POST FEV1/FVC RATIO: 68 %
PRE FEV1/FVC RATIO: 58 %
Post FEV6/FVC ratio: 99 %
Pre FEV6/FVC Ratio: 97 %
RV % pred: 250 %
RV: 4.48 L
TLC % PRED: 130 %
TLC: 8.8 L

## 2015-08-06 MED ORDER — ALBUTEROL SULFATE (2.5 MG/3ML) 0.083% IN NEBU
2.5000 mg | INHALATION_SOLUTION | Freq: Once | RESPIRATORY_TRACT | Status: AC
  Administered 2015-08-06: 2.5 mg via RESPIRATORY_TRACT

## 2015-08-20 ENCOUNTER — Other Ambulatory Visit (HOSPITAL_COMMUNITY): Payer: Self-pay | Admitting: Pulmonary Disease

## 2015-08-21 ENCOUNTER — Ambulatory Visit (HOSPITAL_COMMUNITY)
Admission: RE | Admit: 2015-08-21 | Discharge: 2015-08-21 | Disposition: A | Discharge status: Home / Self Care | Payer: Medicaid Other | Source: Ambulatory Visit | Attending: Pulmonary Disease | Admitting: Pulmonary Disease

## 2015-08-21 DIAGNOSIS — R0602 Shortness of breath: Secondary | ICD-10-CM | POA: Diagnosis present

## 2015-08-21 DIAGNOSIS — Q278 Other specified congenital malformations of peripheral vascular system: Secondary | ICD-10-CM | POA: Diagnosis not present

## 2015-08-21 DIAGNOSIS — R079 Chest pain, unspecified: Secondary | ICD-10-CM

## 2015-08-21 DIAGNOSIS — I2510 Atherosclerotic heart disease of native coronary artery without angina pectoris: Secondary | ICD-10-CM | POA: Diagnosis not present

## 2015-08-21 DIAGNOSIS — I700 Atherosclerosis of aorta: Secondary | ICD-10-CM | POA: Diagnosis not present

## 2015-08-21 DIAGNOSIS — R05 Cough: Secondary | ICD-10-CM | POA: Diagnosis present

## 2015-08-21 MED ORDER — IOPAMIDOL (ISOVUE-370) INJECTION 76%
100.0000 mL | Freq: Once | INTRAVENOUS | Status: AC | PRN
  Administered 2015-08-21: 100 mL via INTRAVENOUS

## 2016-05-29 ENCOUNTER — Emergency Department (HOSPITAL_COMMUNITY): Payer: Medicaid Other

## 2016-05-29 ENCOUNTER — Encounter (HOSPITAL_COMMUNITY): Payer: Self-pay | Admitting: Emergency Medicine

## 2016-05-29 ENCOUNTER — Emergency Department (HOSPITAL_COMMUNITY)
Admission: EM | Admit: 2016-05-29 | Discharge: 2016-05-29 | Disposition: A | Discharge status: Home / Self Care | Payer: Medicaid Other | Attending: Emergency Medicine | Admitting: Emergency Medicine

## 2016-05-29 DIAGNOSIS — R569 Unspecified convulsions: Secondary | ICD-10-CM

## 2016-05-29 DIAGNOSIS — R0789 Other chest pain: Secondary | ICD-10-CM | POA: Diagnosis present

## 2016-05-29 DIAGNOSIS — Z79899 Other long term (current) drug therapy: Secondary | ICD-10-CM | POA: Diagnosis not present

## 2016-05-29 DIAGNOSIS — F17200 Nicotine dependence, unspecified, uncomplicated: Secondary | ICD-10-CM | POA: Insufficient documentation

## 2016-05-29 DIAGNOSIS — J449 Chronic obstructive pulmonary disease, unspecified: Secondary | ICD-10-CM | POA: Diagnosis not present

## 2016-05-29 LAB — CBC WITH DIFFERENTIAL/PLATELET
Basophils Absolute: 0 10*3/uL (ref 0.0–0.1)
Basophils Relative: 0 %
Eosinophils Absolute: 0.2 10*3/uL (ref 0.0–0.7)
Eosinophils Relative: 2 %
HEMATOCRIT: 41.3 % (ref 39.0–52.0)
HEMOGLOBIN: 13.4 g/dL (ref 13.0–17.0)
LYMPHS ABS: 1.7 10*3/uL (ref 0.7–4.0)
Lymphocytes Relative: 22 %
MCH: 28.9 pg (ref 26.0–34.0)
MCHC: 32.4 g/dL (ref 30.0–36.0)
MCV: 89 fL (ref 78.0–100.0)
MONO ABS: 0.6 10*3/uL (ref 0.1–1.0)
MONOS PCT: 8 %
NEUTROS ABS: 5.5 10*3/uL (ref 1.7–7.7)
NEUTROS PCT: 68 %
Platelets: 222 10*3/uL (ref 150–400)
RBC: 4.64 MIL/uL (ref 4.22–5.81)
RDW: 13.2 % (ref 11.5–15.5)
WBC: 8 10*3/uL (ref 4.0–10.5)

## 2016-05-29 LAB — COMPREHENSIVE METABOLIC PANEL
ALBUMIN: 4.3 g/dL (ref 3.5–5.0)
ALK PHOS: 61 U/L (ref 38–126)
ALT: 42 U/L (ref 17–63)
ANION GAP: 8 (ref 5–15)
AST: 30 U/L (ref 15–41)
BILIRUBIN TOTAL: 0.2 mg/dL — AB (ref 0.3–1.2)
BUN: 10 mg/dL (ref 6–20)
CALCIUM: 9.2 mg/dL (ref 8.9–10.3)
CO2: 27 mmol/L (ref 22–32)
Chloride: 105 mmol/L (ref 101–111)
Creatinine, Ser: 0.75 mg/dL (ref 0.61–1.24)
GLUCOSE: 93 mg/dL (ref 65–99)
Potassium: 3.6 mmol/L (ref 3.5–5.1)
Sodium: 140 mmol/L (ref 135–145)
TOTAL PROTEIN: 7.4 g/dL (ref 6.5–8.1)

## 2016-05-29 LAB — I-STAT TROPONIN, ED
TROPONIN I, POC: 0 ng/mL (ref 0.00–0.08)
Troponin i, poc: 0 ng/mL (ref 0.00–0.08)

## 2016-05-29 LAB — D-DIMER, QUANTITATIVE (NOT AT ARMC)

## 2016-05-29 MED ORDER — MORPHINE SULFATE (PF) 2 MG/ML IV SOLN
4.0000 mg | Freq: Once | INTRAVENOUS | Status: AC
  Administered 2016-05-29: 4 mg via INTRAVENOUS
  Filled 2016-05-29: qty 2

## 2016-05-29 MED ORDER — KETOROLAC TROMETHAMINE 30 MG/ML IJ SOLN
30.0000 mg | Freq: Once | INTRAMUSCULAR | Status: AC
  Administered 2016-05-29: 30 mg via INTRAVENOUS
  Filled 2016-05-29: qty 1
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
## 2016-05-29 NOTE — ED Provider Notes (Addendum)
[facility] DEPT Provider Note   CSN: [id number] Arrival date & time: 05/29/16  1612     History   Chief Complaint Chief Complaint  Patient presents with  . Chest Pain    HPI Guy Miller is a 41 y.o. male.  The history is provided by the patient.   41 year old male with history of anxiety and COPD who presents with seizure like activity and left sided chest pain. States he has had episodes of this almost everyday for one month. He has been to [facility], [facility], and [facility] for evaluation of this and states no one has been able to tell him what is going on. States today, was at a picnic when he lost consciousness and started to shake. He came to and had severe left sided chest pain. Pain worse with movement and palpation. Pain constant and sharp in nature. No medications prior to arrival. No alleviating factors. No tongue biting or urinary incontinence. No cough, fever, chills, dyspnea.     Past Medical History:  Diagnosis Date  . Anxiety   . Anxiety   . COPD (chronic obstructive pulmonary disease) [facility])     Patient Active Problem List   Diagnosis Date Noted  . S/P ACL reconstruction 04/13/2012  . Lumbar disc disease with radiculopathy 04/13/2012  . Contracture of right hip 04/13/2012  . Knee pain 04/13/2012    Past Surgical History:  Procedure Laterality Date  . ELBOW SURGERY    . KNEE SURGERY         Home Medications    Prior to Admission medications   Medication Sig Start Date End Date Taking? Authorizing Provider  albuterol (PROVENTIL) (2.5 MG/3ML) 0.083% nebulizer solution Take 3 mLs (2.5 mg total) by nebulization every 6 (six) hours as needed for wheezing or shortness of breath. 05/14/15  Yes Nat Christen, MD  albuterol (VENTOLIN HFA) 108 (90 BASE) MCG/ACT inhaler Inhale 2 puffs into the lungs every 6 (six) hours as needed for wheezing.   Yes Historical Provider, MD  cyclobenzaprine (FLEXERIL) 10 MG tablet Take 10  mg by mouth 3 (three) times daily.    Historical Provider, MD    Family History Family History  Problem Relation Age of Onset  . Arthritis    . Diabetes      Social History Social History  Substance Use Topics  . Smoking status: Current Every Day Smoker    Packs/day: 2.00  . Smokeless tobacco: Never Used  . Alcohol use No     Allergies   Gabapentin; Penicillins; Tramadol; Trazodone and nefazodone; Buspar [buspirone]; Demerol [meperidine]; Ibuprofen; Prednisone; and Tylenol [acetaminophen]   Review of Systems Review of Systems  Constitutional: Negative for fever.  HENT: Negative for congestion.   Respiratory: Negative for cough.   Cardiovascular: Positive for chest pain. Negative for leg swelling.  Genitourinary: Negative for difficulty urinating.  Skin: Negative for wound.  Allergic/Immunologic: Negative for immunocompromised state.  Neurological: Positive for seizures and syncope.  Hematological: Does not bruise/bleed easily.  All other systems reviewed and are negative.    Physical Exam Updated Vital Signs BP 116/82   Pulse 67   Temp 98 F (36.7 C) (Oral)   Resp 17   Ht '5\' 9"'$  (1.753 m)   Wt 169 lb (76.7 kg)   SpO2 97%   BMI 24.96 kg/m   Physical Exam Physical Exam  Nursing note and vitals reviewed. Constitutional:  non-toxic, and in no acute distress Head: Normocephalic and atraumatic.  Mouth/Throat:  Oropharynx is clear and moist.  Neck: Normal range of motion. Neck supple.  Cardiovascular: Normal rate and regular rhythm.   Pulmonary/Chest: Effort normal and breath sounds normal. Left chest wall tenderness Abdominal: Soft. There is no tenderness. There is no rebound and no guarding.  Musculoskeletal: Normal range of motion.  Neurological: Alert, PERRL,  no facial droop, fluent speech, moves all extremities symmetrically Skin: Skin is warm and dry.  Psychiatric: Cooperative   ED Treatments / Results  Labs (all labs ordered are listed, but only  abnormal results are displayed) Labs Reviewed  COMPREHENSIVE METABOLIC PANEL - Abnormal; Notable for the following:       Result Value   Total Bilirubin 0.2 (*)    All other components within normal limits  CBC WITH DIFFERENTIAL/PLATELET  D-DIMER, QUANTITATIVE (NOT AT [facility])  I-STAT TROPOININ, ED  I-STAT TROPOININ, ED    EKG  EKG Interpretation  Date/Time:  Saturday May 29 2016 16:14:07 EDT Ventricular Rate:  91 PR Interval:    QRS Duration: 99 QT Interval:  340 QTC Calculation: 419 R Axis:   30 Text Interpretation:  Sinus rhythm Probable left atrial enlargement RSR' in V1 or V2, right VCD or RVH Nonspecific T abnormalities, diffuse leads Twave abnormalities more pronounced compared to 2015-06-12 Confirmed by Adelfa Lozito MD, Jhamir Pickup [phone]) on 05/29/2016 4:39:34 PM       Radiology Dg Chest 2 View  Result Date: 05/29/2016 CLINICAL DATA:  Chest pain, shortness of breath, seizure like activity EXAM: CHEST  2 VIEW COMPARISON:  12-Jun-2015 FINDINGS: Cardiomediastinal silhouette is stable. No infiltrate or pleural effusion. No pulmonary edema. Bony thorax is unremarkable. IMPRESSION: No active cardiopulmonary disease. Electronically Signed   By: Lahoma Crocker M.D.   On: 05/29/2016 18:03    Procedures Procedures (including critical care time)  Medications Ordered in ED Medications  morphine 2 MG/ML injection 4 mg (4 mg Intravenous Given 05/29/16 1704)  ketorolac (TORADOL) 30 MG/ML injection 30 mg (30 mg Intravenous Given 05/29/16 1855)     Initial Impression / Assessment and Plan / ED Course  I have reviewed the triage vital signs and the nursing notes.  Pertinent labs & imaging results that were available during my care of the patient were reviewed by me and considered in my medical decision making (see chart for details).      I reviewed the available records in [facility] and also Care Everywhere. Outside records requested from [facility]. Patient also with visit to [facility].  Records at [facility] indicate that he received a CT angiogram of the chest showing normal aorta and no evidence of PE. He had normal serial troponins, and a nuclear stress test that was normal. He had a EEG that showed no seizure activity. He had an MRI that showed no acute intracranial findings but there was some mild nonspecific white matter disease. He also had carotid Dopplers done that was normal. An LP was recommended, but patient left AMA. He states that he left AMA because they did not feed him. He was seen yesterday at [facility] for syncopal episode with seizure-like activities and left-sided chest pain. He had a normal CT and unremarkable cardiac workup. It was felt that he can continue outpatient workup with outpatient neurologist.  During ED course, he did have witnessed episode of seizure-like activity. No concerns for active seizure or syncope. Patient would stare off, not speak and start mouthing words. He had random shaking of extremities that would alternate. When I called out his name, he would  look over at me. Consistent with non-epileptic seizure. Chest pain also reproduced with palpation of left chest. This is unlikely to be acute cardiopulmonary process or intrathoracic process especially given his recent normal CT and cardiac workup. Serial troponins are normal here today. Chest x-ray visualized shows no acute cardiopulmonary processes. No CT performed as normal CT for similar episode yesterday.  Given reassuring recent workup, I do not feel that he requires admission for ongoing workup. I did briefly discuss patient presentation with Dr. Leonel Ramsay from neurology who did not feel the patient required any additional workup as an inpatient at this time. Outpatient neurology referral was provided for patient for ongoing workup as needed.  The patient appears reasonably screened and/or stabilized for discharge and I doubt any other medical condition or other [facility] requiring further  screening, evaluation, or treatment in the ED at this time prior to discharge.  Strict return and follow-up instructions reviewed. They expressed understanding of all discharge instructions and felt comfortable with the plan of care.    Final Clinical Impressions(s) / ED Diagnoses   Final diagnoses:  Seizure-like activity ([place])  Chest wall pain    New Prescriptions Discharge Medication List as of 05/29/2016  8:28 PM       Forde Dandy, MD 05/30/16 [place] Rennie Hack, MD 05/30/16 [phone]

## 2016-05-29 NOTE — ED Notes (Signed)
Pt still not talking but moaning with eyes open and using hand gestures.  Girlfriend came to desk stating he needs more pain meds.  MD made aware.

## 2016-05-29 NOTE — ED Notes (Signed)
Pt re[ports that he and spouse are frustrated that he has not seen a neurologist since he has been here- that he has been to now 4 hospitals and has found out nothing.  He is asked if he has a Engineer, water, therapist and if he has explored if this (these) events are stress related. He states that his stress is related to his riding in an ambulance and not being told what is going on with him. Both patient and spouse are very anxious, and upset that they have no answers for his conidition

## 2016-05-29 NOTE — Discharge Instructions (Signed)
Your work-up is reassuring today as well as through [place].  Please follow-up with neurologist. Information provided above.

## 2016-05-29 NOTE — ED Notes (Signed)
On arrival, pt was having normal fluent conversation.  Wife has arrived and now pt is only using hand gestures to speak.  Pt had a jerking episode where he flapped his arms and legs but remained alert and could shake head yes or no to questions.  Dr. Oleta Mouse made aware and states he did the same during her assessment.

## 2016-06-28 ENCOUNTER — Emergency Department
Admission: EM | Admit: 2016-06-28 | Discharge: 2016-06-28 | Disposition: A | Discharge status: Home / Self Care | Payer: Medicaid Other | Attending: Emergency Medicine | Admitting: Emergency Medicine

## 2016-06-28 ENCOUNTER — Emergency Department: Payer: Medicaid Other

## 2016-06-28 ENCOUNTER — Encounter: Payer: Self-pay | Admitting: Emergency Medicine

## 2016-06-28 DIAGNOSIS — R1031 Right lower quadrant pain: Secondary | ICD-10-CM | POA: Insufficient documentation

## 2016-06-28 DIAGNOSIS — J449 Chronic obstructive pulmonary disease, unspecified: Secondary | ICD-10-CM | POA: Diagnosis not present

## 2016-06-28 DIAGNOSIS — F17200 Nicotine dependence, unspecified, uncomplicated: Secondary | ICD-10-CM | POA: Diagnosis not present

## 2016-06-28 DIAGNOSIS — R109 Unspecified abdominal pain: Secondary | ICD-10-CM

## 2016-06-28 HISTORY — DX: Calculus of kidney: N20.0

## 2016-06-28 LAB — URINALYSIS, COMPLETE (UACMP) WITH MICROSCOPIC
BACTERIA UA: NONE SEEN
BILIRUBIN URINE: NEGATIVE
Glucose, UA: NEGATIVE mg/dL
HGB URINE DIPSTICK: NEGATIVE
Ketones, ur: NEGATIVE mg/dL
Leukocytes, UA: NEGATIVE
Nitrite: NEGATIVE
PH: 7 (ref 5.0–8.0)
Protein, ur: NEGATIVE mg/dL
RBC / HPF: NONE SEEN RBC/hpf (ref 0–5)
SPECIFIC GRAVITY, URINE: 1.005 (ref 1.005–1.030)
SQUAMOUS EPITHELIAL / LPF: NONE SEEN
WBC UA: NONE SEEN WBC/hpf (ref 0–5)

## 2016-06-28 LAB — LIPASE, BLOOD: LIPASE: 19 U/L (ref 11–51)

## 2016-06-28 LAB — CBC
HEMATOCRIT: 40.5 % (ref 40.0–52.0)
HEMOGLOBIN: 13.7 g/dL (ref 13.0–18.0)
MCH: 29.2 pg (ref 26.0–34.0)
MCHC: 33.8 g/dL (ref 32.0–36.0)
MCV: 86.3 fL (ref 80.0–100.0)
Platelets: 273 10*3/uL (ref 150–440)
RBC: 4.69 MIL/uL (ref 4.40–5.90)
RDW: 14.1 % (ref 11.5–14.5)
WBC: 6.3 10*3/uL (ref 3.8–10.6)

## 2016-06-28 LAB — COMPREHENSIVE METABOLIC PANEL
ALBUMIN: 4.5 g/dL (ref 3.5–5.0)
ALK PHOS: 52 U/L (ref 38–126)
ALT: 19 U/L (ref 17–63)
ANION GAP: 8 (ref 5–15)
AST: 34 U/L (ref 15–41)
BUN: 11 mg/dL (ref 6–20)
CALCIUM: 9 mg/dL (ref 8.9–10.3)
CHLORIDE: 104 mmol/L (ref 101–111)
CO2: 25 mmol/L (ref 22–32)
Creatinine, Ser: 0.92 mg/dL (ref 0.61–1.24)
GFR calc Af Amer: >60 mL/min (ref >60)
GFR calc non Af Amer: >60 mL/min (ref >60)
GLUCOSE: 107 mg/dL — AB (ref 65–99)
POTASSIUM: 4.1 mmol/L (ref 3.5–5.1)
SODIUM: 137 mmol/L (ref 135–145)
Total Bilirubin: 0.4 mg/dL (ref 0.3–1.2)
Total Protein: 7.6 g/dL (ref 6.5–8.1)

## 2016-06-28 MED ORDER — KETOROLAC TROMETHAMINE 30 MG/ML IJ SOLN
30.0000 mg | Freq: Once | INTRAMUSCULAR | Status: DC
  Filled 2016-06-28: qty 1

## 2016-06-28 MED ORDER — MORPHINE SULFATE (PF) 4 MG/ML IV SOLN
4.0000 mg | Freq: Once | INTRAVENOUS | Status: AC
  Administered 2016-06-28: 4 mg via INTRAVENOUS
  Filled 2016-06-28: qty 1

## 2016-06-28 MED ORDER — SODIUM CHLORIDE 0.9 % IV SOLN
1000.0000 mL | Freq: Once | INTRAVENOUS | Status: AC
  Administered 2016-06-28: 1000 mL via INTRAVENOUS

## 2016-06-28 MED ORDER — NAPROXEN 500 MG PO TABS: 500.0000 mg | ORAL_TABLET | Freq: Two times a day (BID) | ORAL | 2 refills | Status: AC

## 2016-06-28 MED ORDER — ONDANSETRON HCL 4 MG/2ML IJ SOLN
4.0000 mg | Freq: Once | INTRAMUSCULAR | Status: AC
  Administered 2016-06-28: 4 mg via INTRAVENOUS
  Filled 2016-06-28: qty 2

## 2016-06-28 NOTE — ED Notes (Signed)
Patient transported to CT 

## 2016-06-28 NOTE — ED Provider Notes (Signed)
[facility] Emergency Department Provider Note   ____________________________________________    I have reviewed the triage vital signs and the nursing notes.   HISTORY  Chief Complaint Abdominal Pain     HPI Guy Miller is a 41 y.o. male who p/w complaints of rlq/ right flank abdominal pain that started at 2 am. He describes the pain as 8/10, sharp and constant. He has never experienced this before. Has not taken anything for it. No vomiting but some nausea. Normal stools. No hx of abdominal surgery. Pain started while urinating. No Fevers or chills   Past Medical History:  Diagnosis Date  . Anxiety   . Anxiety   . COPD (chronic obstructive pulmonary disease) ([place])   . Kidney stones     Patient Active Problem List   Diagnosis Date Noted  . S/P ACL reconstruction 04/13/2012  . Lumbar disc disease with radiculopathy 04/13/2012  . Contracture of right hip 04/13/2012  . Knee pain 04/13/2012    Past Surgical History:  Procedure Laterality Date  . ELBOW SURGERY    . KNEE SURGERY      Prior to Admission medications   Medication Sig Start Date End Date Taking? Authorizing Provider  albuterol (PROVENTIL) (2.5 MG/3ML) 0.083% nebulizer solution Take 3 mLs (2.5 mg total) by nebulization every 6 (six) hours as needed for wheezing or shortness of breath. 05/14/15   Nat Christen, MD  albuterol (VENTOLIN HFA) 108 (90 BASE) MCG/ACT inhaler Inhale 2 puffs into the lungs every 6 (six) hours as needed for wheezing.    [provider]  cyclobenzaprine (FLEXERIL) 10 MG tablet Take 10 mg by mouth 3 (three) times daily.    [provider]     Allergies Gabapentin; Penicillins; Tramadol; Trazodone and nefazodone; Buspar [buspirone]; Demerol [meperidine]; Hydrocodone; Ibuprofen; Prednisone; Toradol [ketorolac tromethamine]; Tylenol [acetaminophen]; and Oxycodone  Family History  Problem Relation Age of Onset  . Arthritis Unknown   .  Diabetes Unknown     Social History Social History  Substance Use Topics  . Smoking status: Current Every Day Smoker    Packs/day: 1.00  . Smokeless tobacco: Never Used  . Alcohol use No    Review of Systems  Constitutional: No fever/chills Eyes: No visual changes.  ENT: No sore throat. Cardiovascular: Denies chest pain. Respiratory: Denies shortness of breath. Gastrointestinal:  As above Genitourinary: Negative for dysuria. No hematuria Musculoskeletal: Negative for back pain. Skin: Negative for rash. Neurological: Negative for headaches  ____________________________________________   PHYSICAL EXAM:  VITAL SIGNS: ED Triage Vitals  Enc Vitals Group     BP 06/28/16 0852 113/71     Pulse Rate 06/28/16 0852 82     Resp 06/28/16 0852 20     Temp 06/28/16 0852 97.8 F (36.6 C)     Temp Source 06/28/16 0852 Oral     SpO2 06/28/16 0852 98 %     Weight 06/28/16 0853 174 lb (78.9 kg)     Height 06/28/16 0853 '5\' 9"'$  (1.753 m)     Head Circumference --      Peak Flow --      Pain Score 06/28/16 0852 10     Pain Loc --      Pain Edu? --      Excl. in [place]? --     Constitutional: Alert and oriented. No acute distress. Pleasant and interactive Eyes: Conjunctivae are normal.  Head: Atraumatic. Nose: No congestion/rhinnorhea. Mouth/Throat: Mucous membranes are moist.   Neck:  Painless ROM Cardiovascular: Normal rate, regular rhythm. Grossly normal heart sounds.  Good peripheral circulation. Respiratory: Normal respiratory effort.  No retractions. Lungs CTAB. Gastrointestinal: Mild ttp rlq. No distention.  No CVA tenderness. Genitourinary: deferred Musculoskeletal: No lower extremity tenderness nor edema.  Warm and well perfused Neurologic:  Normal speech and language. No gross focal neurologic deficits are appreciated.  Skin:  Skin is warm, dry and intact. No rash noted. Psychiatric: Mood and affect are normal. Speech and behavior are  normal.  ____________________________________________   LABS (all labs ordered are listed, but only abnormal results are displayed)  Labs Reviewed  COMPREHENSIVE METABOLIC PANEL - Abnormal; Notable for the following:       Result Value   Glucose, Bld 107 (*)    All other components within normal limits  LIPASE, BLOOD  CBC  URINALYSIS, COMPLETE (UACMP) WITH MICROSCOPIC   ____________________________________________  EKG  none ____________________________________________  RADIOLOGY  Ct renal stone study ____________________________________________   PROCEDURES  Procedure(s) performed: No    Critical Care performed: No ____________________________________________   INITIAL IMPRESSION / ASSESSMENT AND PLAN / ED COURSE  Pertinent labs & imaging results that were available during my care of the patient were reviewed by me and considered in my medical decision making (see chart for details).  Patient's presentation suspicious for ureterolithiasis vs appendicitis. Labs reassuring, will obtain ct renal stone study.   Patient has allergies to nearly all categories pain medication.   ----------------------------------------- 10:09 AM on 06/28/2016 -----------------------------------------  CT negative for appy or stone.    Patient states he still has some pain, he notes he is able to take morphine. 4 mg morphine given.   ----------------------------------------- 1:03 PM on 06/28/2016 -----------------------------------------  Patient well-appearing reports his pain is here feeling better. CT negative as above. Lab work is reassuring. We'll discharge with outpatient follow-up. Return precautions discussed. Patient agrees with this plan.    ____________________________________________   FINAL CLINICAL IMPRESSION(S) / ED DIAGNOSES  Final diagnoses:  Acute right flank pain      NEW MEDICATIONS STARTED DURING THIS VISIT:  New Prescriptions   No  medications on file     Note:  This document was prepared using Dragon voice recognition software and may include unintentional dictation errors.    Lavonia Drafts, MD 06/28/16 [phone]

## 2016-06-28 NOTE — ED Triage Notes (Signed)
States R lower abd pain since 2 am.

## 2016-07-26 ENCOUNTER — Emergency Department: Payer: Medicaid Other

## 2016-07-26 ENCOUNTER — Emergency Department
Admission: EM | Admit: 2016-07-26 | Discharge: 2016-07-26 | Disposition: A | Discharge status: Home / Self Care | Payer: Medicaid Other | Attending: Emergency Medicine | Admitting: Emergency Medicine

## 2016-07-26 ENCOUNTER — Encounter: Payer: Self-pay | Admitting: Emergency Medicine

## 2016-07-26 DIAGNOSIS — Z85118 Personal history of other malignant neoplasm of bronchus and lung: Secondary | ICD-10-CM | POA: Diagnosis not present

## 2016-07-26 DIAGNOSIS — J449 Chronic obstructive pulmonary disease, unspecified: Secondary | ICD-10-CM | POA: Diagnosis not present

## 2016-07-26 DIAGNOSIS — R079 Chest pain, unspecified: Secondary | ICD-10-CM | POA: Diagnosis not present

## 2016-07-26 DIAGNOSIS — Z87891 Personal history of nicotine dependence: Secondary | ICD-10-CM | POA: Insufficient documentation

## 2016-07-26 DIAGNOSIS — R0602 Shortness of breath: Secondary | ICD-10-CM

## 2016-07-26 HISTORY — DX: Malignant neoplasm of unspecified part of unspecified bronchus or lung: C34.90

## 2016-07-26 LAB — CBC
HCT: 44 % (ref 40.0–52.0)
HEMOGLOBIN: 14.6 g/dL (ref 13.0–18.0)
MCH: 29.1 pg (ref 26.0–34.0)
MCHC: 33.3 g/dL (ref 32.0–36.0)
MCV: 87.5 fL (ref 80.0–100.0)
Platelets: 237 10*3/uL (ref 150–440)
RBC: 5.02 MIL/uL (ref 4.40–5.90)
RDW: 14.1 % (ref 11.5–14.5)
WBC: 8 10*3/uL (ref 3.8–10.6)

## 2016-07-26 LAB — COMPREHENSIVE METABOLIC PANEL
ALT: 41 U/L (ref 17–63)
AST: 27 U/L (ref 15–41)
Albumin: 4.8 g/dL (ref 3.5–5.0)
Alkaline Phosphatase: 67 U/L (ref 38–126)
Anion gap: 9 (ref 5–15)
BILIRUBIN TOTAL: 0.8 mg/dL (ref 0.3–1.2)
BUN: 12 mg/dL (ref 6–20)
CALCIUM: 9.5 mg/dL (ref 8.9–10.3)
CO2: 26 mmol/L (ref 22–32)
CREATININE: 0.86 mg/dL (ref 0.61–1.24)
Chloride: 103 mmol/L (ref 101–111)
GFR calc Af Amer: >60 mL/min (ref >60)
Glucose, Bld: 101 mg/dL — ABNORMAL HIGH (ref 65–99)
Potassium: 4 mmol/L (ref 3.5–5.1)
Sodium: 138 mmol/L (ref 135–145)
TOTAL PROTEIN: 8.2 g/dL — AB (ref 6.5–8.1)

## 2016-07-26 LAB — TROPONIN I: Troponin I: <0.03 ng/mL (ref <0.03)

## 2016-07-26 MED ORDER — IOPAMIDOL (ISOVUE-370) INJECTION 76%
75.0000 mL | Freq: Once | INTRAVENOUS | Status: AC | PRN
  Administered 2016-07-26: 75 mL via INTRAVENOUS

## 2016-07-26 MED ORDER — SODIUM CHLORIDE 0.9 % IV BOLUS (SEPSIS)
1000.0000 mL | Freq: Once | INTRAVENOUS | Status: AC
  Administered 2016-07-26: 1000 mL via INTRAVENOUS

## 2016-07-26 MED ORDER — LORAZEPAM 1 MG PO TABS
1.0000 mg | ORAL_TABLET | Freq: Once | ORAL | Status: AC
  Administered 2016-07-26: 1 mg via ORAL
  Filled 2016-07-26: qty 1

## 2016-07-26 MED ORDER — ACETAMINOPHEN 500 MG PO TABS
1000.0000 mg | ORAL_TABLET | Freq: Once | ORAL | Status: AC
  Administered 2016-07-26: 1000 mg via ORAL
  Filled 2016-07-26: qty 2

## 2016-07-26 MED ORDER — OXYCODONE HCL 5 MG PO TABS
5.0000 mg | ORAL_TABLET | Freq: Once | ORAL | Status: AC
  Administered 2016-07-26: 5 mg via ORAL
  Filled 2016-07-26: qty 1

## 2016-07-26 NOTE — ED Provider Notes (Signed)
[facility] Emergency Department Provider Note    First MD Initiated Contact with Patient 07/26/16 1400     (approximate)  I have reviewed the triage vital signs and the nursing notes.   HISTORY  Chief Complaint Chest Pain    HPI [name] is a 41 y.o. male history of anxiety, COPD and lung cancer presents with chief complaint of shortness of breath chest pain and blurry vision associated with nausea. Describes the chest pain as feeling that he's got a lot of pressure on his chest. This all started while the patient was in bed 26 in the ER here with his wife who is being taken to the operating room for emergency surgery. Patient states that when he was being told this he started feeling lightheaded and developed shortness of breath and chest pain. Patient was to Initially but was able to be calmed down. States it does feel similar to previous episodes of anxiety patient is also concerned with the shortness of breath as he states that he was told that he had lung cancer and did not follow-up at [facility].   Past Medical History:  Diagnosis Date  . Anxiety   . Anxiety   . COPD (chronic obstructive pulmonary disease) ([place])   . Kidney stones   . Lung cancer ([place])    Family History  Problem Relation Age of Onset  . Arthritis Unknown   . Diabetes Unknown    Past Surgical History:  Procedure Laterality Date  . ELBOW SURGERY    . KNEE SURGERY     Patient Active Problem List   Diagnosis Date Noted  . S/P ACL reconstruction 04/13/2012  . Lumbar disc disease with radiculopathy 04/13/2012  . Contracture of right hip 04/13/2012  . Knee pain 04/13/2012      Prior to Admission medications   Medication Sig Start Date End Date Taking? Authorizing Provider  albuterol (PROVENTIL) (2.5 MG/3ML) 0.083% nebulizer solution Take 3 mLs (2.5 mg total) by nebulization every 6 (six) hours as needed for wheezing or shortness of breath. 05/14/15  Yes Nat Christen, MD    albuterol (VENTOLIN HFA) 108 (90 BASE) MCG/ACT inhaler Inhale 2 puffs into the lungs every 6 (six) hours as needed for wheezing.   Yes [provider]  naproxen (NAPROSYN) 500 MG tablet Take 1 tablet (500 mg total) by mouth 2 (two) times daily with a meal. Patient not taking: Reported on 07/26/2016 06/28/16   Lavonia Drafts, MD    Allergies Gabapentin; Penicillins; Tramadol; Trazodone and nefazodone; Buspar [buspirone]; Hydrocodone; Ibuprofen; Prednisone; Toradol [ketorolac tromethamine]; Tylenol [acetaminophen]; Demerol [meperidine]; and Oxycodone    Social History Social History  Substance Use Topics  . Smoking status: Current Every Day Smoker    Packs/day: 1.00  . Smokeless tobacco: Never Used  . Alcohol use No    Review of Systems Patient denies headaches, rhinorrhea, blurry vision, numbness, shortness of breath, chest pain, edema, cough, abdominal pain, nausea, vomiting, diarrhea, dysuria, fevers, rashes or hallucinations unless otherwise stated above in HPI. ____________________________________________   PHYSICAL EXAM:  VITAL SIGNS: Vitals:   07/26/16 1430 07/26/16 1500  BP: 110/87 109/84  Pulse: (!) 59 (!) 56  Resp: 16 15  Temp:      Constitutional: Alert and oriented. Anxious appearing but in no acute distress. Eyes: Conjunctivae are normal.  Head: Atraumatic. Nose: No congestion/rhinnorhea. Mouth/Throat: Mucous membranes are moist.   Neck: No stridor. Painless ROM.  Cardiovascular: Normal rate, regular rhythm. Grossly normal heart sounds.  Good peripheral circulation. Respiratory: Normal respiratory effort.  No retractions. Lungs CTAB. Gastrointestinal: Soft and nontender. No distention. No abdominal bruits. No CVA tenderness. Musculoskeletal: No lower extremity tenderness nor edema.  No joint effusions. Neurologic:  Normal speech and language. No gross focal neurologic deficits are appreciated. No facial droop Skin:  Skin is warm, dry and intact. No  rash noted. Psychiatric: anxious but in NAD  ____________________________________________   LABS (all labs ordered are listed, but only abnormal results are displayed)  Results for orders placed or performed during the hospital encounter of 07/26/16 (from the past 24 hour(s))  CBC     Status: None   Collection Time: 07/26/16 10:44 AM  Result Value Ref Range   WBC 8.0 3.8 - 10.6 K/uL   RBC 5.02 4.40 - 5.90 MIL/uL   Hemoglobin 14.6 13.0 - 18.0 g/dL   HCT 44.0 40.0 - 52.0 %   MCV 87.5 80.0 - 100.0 fL   MCH 29.1 26.0 - 34.0 pg   MCHC 33.3 32.0 - 36.0 g/dL   RDW 14.1 11.5 - 14.5 %   Platelets 237 150 - 440 K/uL  Troponin I     Status: None   Collection Time: 07/26/16 10:44 AM  Result Value Ref Range   Troponin I <0.03 <0.03 ng/mL  Comprehensive metabolic panel     Status: Abnormal   Collection Time: 07/26/16 10:44 AM  Result Value Ref Range   Sodium 138 135 - 145 mmol/L   Potassium 4.0 3.5 - 5.1 mmol/L   Chloride 103 101 - 111 mmol/L   CO2 26 22 - 32 mmol/L   Glucose, Bld 101 (H) 65 - 99 mg/dL   BUN 12 6 - 20 mg/dL   Creatinine, Ser 0.86 0.61 - 1.24 mg/dL   Calcium 9.5 8.9 - 10.3 mg/dL   Total Protein 8.2 (H) 6.5 - 8.1 g/dL   Albumin 4.8 3.5 - 5.0 g/dL   AST 27 15 - 41 U/L   ALT 41 17 - 63 U/L   Alkaline Phosphatase 67 38 - 126 U/L   Total Bilirubin 0.8 0.3 - 1.2 mg/dL   GFR calc non Af Amer >60 >60 mL/min   GFR calc Af Amer >60 >60 mL/min   Anion gap 9 5 - 15   ____________________________________________  EKG My review and personal interpretation at Time: 10:34   Indication: chest pain  Rate: 75  Rhythm: sinus Axis: normal Other: occasional PVC, no acute ischemia ____________________________________________  RADIOLOGY  I personally reviewed all radiographic images ordered to evaluate for the above acute complaints and reviewed radiology reports and findings.  These findings were personally discussed with the patient.  Please see medical record for radiology  report.  ____________________________________________   PROCEDURES  Procedure(s) performed:  Procedures    Critical Care performed: no ____________________________________________   INITIAL IMPRESSION / ASSESSMENT AND PLAN / ED COURSE  Pertinent labs & imaging results that were available during my care of the patient were reviewed by me and considered in my medical decision making (see chart for details).  DDX: ACS, pericarditis, esophagitis, boerhaaves, pe, dissection, pna, bronchitis, costochondritis   BRENDON CHRISTOFFEL is a 41 y.o. who presents to the ED with chest pain, shortness of breath, dizziness as described above. Patient afebrile and hemodynamic stable upon arrival to the ER bed. His symptoms and history seem very consistent with anxiety attack but the patient does have reported active malignancy. EKG shows nonspecific changes but no evidence of acute ST elevation MI.  Troponin is negative. CT imaging ordered to evaluate for pulmonary embolism bolus him or worsening of his malignancy. Patient given dose of Ativan with improvement in symptoms. Given IV fluids for possible dehydration.  Patient was signed out to Dr. Alfred Levins pending results of CT angiogram and repeat troponin. I do anticipate discharge home.      ____________________________________________   FINAL CLINICAL IMPRESSION(S) / ED DIAGNOSES  Final diagnoses:  Chest pain, unspecified type      NEW MEDICATIONS STARTED DURING THIS VISIT:  New Prescriptions   No medications on file     Note:  This document was prepared using Dragon voice recognition software and may include unintentional dictation errors.    Merlyn Lot, MD 07/26/16 [phone]

## 2016-07-26 NOTE — ED Triage Notes (Signed)
Pt was here, visiting with wife who is a patient, and started having chest pain. Pain worse with touch. Pt's wife is having to undergo and additional surgery. Pt hyperventilating upon arrival to triage.

## 2016-07-26 NOTE — ED Notes (Signed)
Pt aroused from sleep and given cup of water to drink

## 2016-07-26 NOTE — ED Provider Notes (Signed)
-----------------------------------------   8:05 PM on 07/26/2016 -----------------------------------------   Blood pressure 112/80, pulse 79, temperature 97.7 F (36.5 C), temperature source Oral, resp. rate 13, height 5\' 9"  (1.753 m), weight 78.9 kg (174 lb), SpO2 98 %.  Assuming care from Dr. Quentin Cornwall of [facility] is a 41 y.o. male with a chief complaint of Chest Pain .    In summary, 41 y.o. male history of anxiety, COPD and lung cancer presents with chief complaint of shortness of breath chest pain and blurry vision associated with nausea.  Please refer to Dr. Ruffin Frederick H&P for further details.  I was asked by Dr. Quentin Cornwall to follow up the results of CT of his chest and second troponin. CT is negative and so is the second troponin. Patient is requesting discharge at this time. He is being referred to cardiology and primary care doctor for further management.  CT ANGIO CHEST PE W OR WO CONTRAST (Final result)  Result time 07/26/16 16:06:31  Procedure changed from CT Angio Chest Aorta W and/or Wo Contrast  Final result by Erling Conte, MD (07/26/16 16:06:31)           Narrative:   CLINICAL DATA: Shortness of breath and chest pain  EXAM: CT ANGIOGRAPHY CHEST WITH CONTRAST  TECHNIQUE: Multidetector CT imaging of the chest was performed using the standard protocol during bolus administration of intravenous contrast. Multiplanar CT image reconstructions and MIPs were obtained to evaluate the vascular anatomy.  CONTRAST: 75 mL Isovue 370 nonionic  COMPARISON: Chest radiograph July 26, 2016  FINDINGS: Cardiovascular: There is no demonstrable pulmonary embolus. There is no thoracic aortic aneurysm or dissection. The visualized great vessels appear normal. Note that the right and left common carotid arteries arise as a common trunk, an anatomic variant. There is mild left ventricular hypertrophy. There is no pericardial effusion  or thickening.  Mediastinum/Nodes: Visualized thyroid appears normal. There is no appreciable thoracic adenopathy. There is a small hiatal hernia.  Lungs/Pleura: There is minimal bibasilar atelectasis. There is no edema or consolidation. There is no pleural effusion or pleural thickening.  Upper Abdomen: In the visualized upper abdomen, there is relatively mild reflux of contrast into the inferior vena cava and hepatic veins. Visualized upper abdominal structures otherwise appear unremarkable.  Musculoskeletal: There are no blastic or lytic bone lesions. There are no chest wall lesions evident.  Review of the MIP images confirms the above findings.  IMPRESSION: No demonstrable pulmonary embolus. No thoracic aortic aneurysm or dissection.  Reflux of contrast into the inferior vena cava and hepatic veins may indicate a degree of increase in right heart pressure.  There is mild left ventricular hypertrophy.  No edema or consolidation. No adenopathy.   Electronically Signed By: Lowella Grip III M.D. On: 07/26/2016 16:06            Rudene Re, MD 07/26/16 2008

## 2016-07-26 NOTE — ED Notes (Addendum)
Pt c/o shortness of breath, blurry vision, nausea, chest pain (feels like car sitting on chest) - pt states he has COPD, lung cancer, and MS - pt is not being treated for lung cancer at this time - he states that [facility] never sent him the cancer follow up information so he never followed up with them - pt states the chest pain is worse when he takes a deep breath - pain radiates across chest to right side

## 2016-07-26 NOTE — Discharge Instructions (Signed)

## 2016-07-26 NOTE — ED Triage Notes (Signed)
Patient was visiting wife in room 26, began feeling lightheaded, chest pain. Noted rapid resp in triage. Able to reduce to regular rate during triage.

## 2017-12-13 IMAGING — MR MR CERVICAL SPINE W/O CM
5 series · 36 of 48 positions shown · non-contrast
Comparison: MRIs dated 04/29/2009, 05/23/2008 and 11/01/2007

CLINICAL DATA: Progressive neck pain and right arm pain with
numbness and tingling for 5 months.

EXAM:
MRI CERVICAL SPINE WITHOUT CONTRAST
TECHNIQUE: Multiplanar, multisequence MR imaging of the cervical spine was
performed. No intravenous contrast was administered.

[Series 2: T2 · sagittal · 3.0mm · 0.70mm/px · 8 of 15 slices shown (1 of 2)]
[im 1/15]
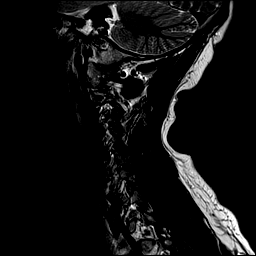
[im 3/15]
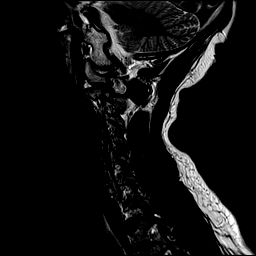
[im 5/15]
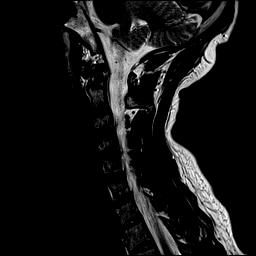
[im 7/15]
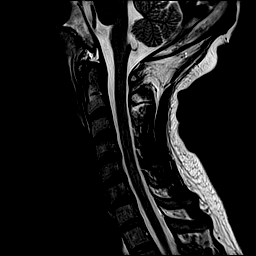
[im 9/15]
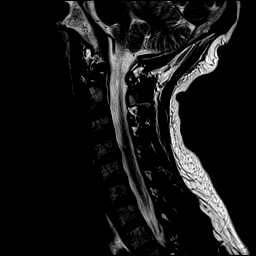
[im 11/15]
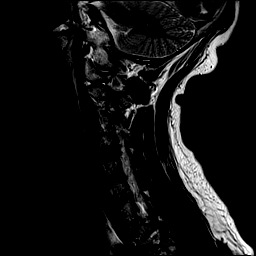
[im 13/15]
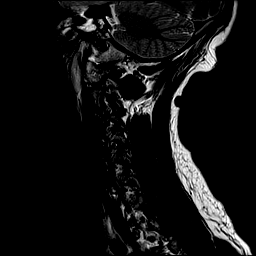
[im 15/15]
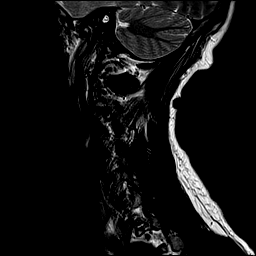

[Series 4: STIR · sagittal · 3.0mm · 0.35mm/px · 7 of 15 slices shown]
[im 1/15]
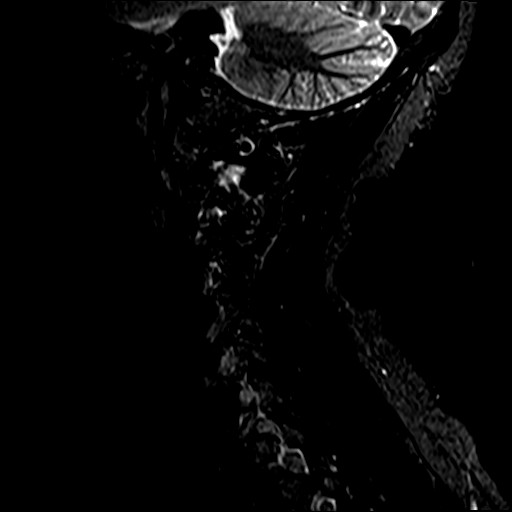
[im 3/15]
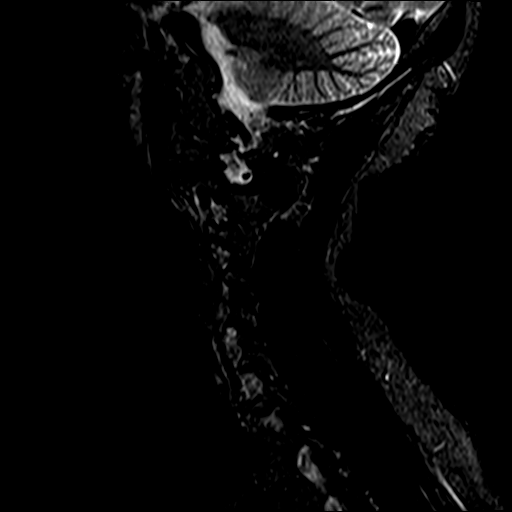
[im 5/15]
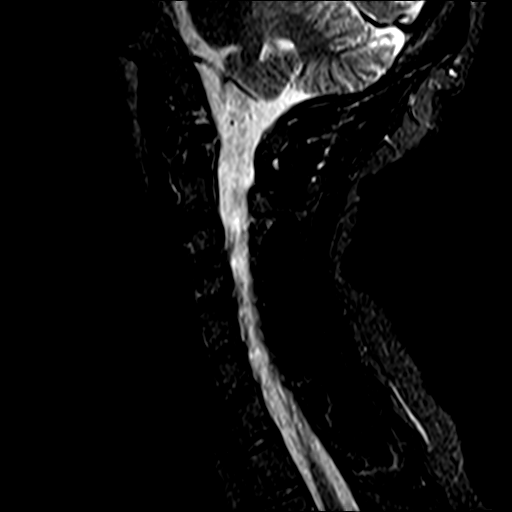
[im 8/15]
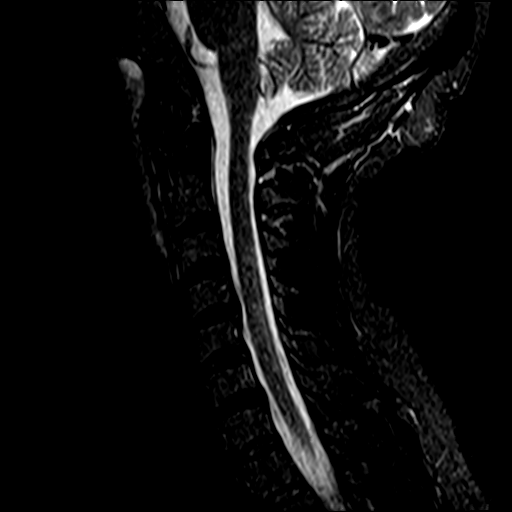
[im 10/15]
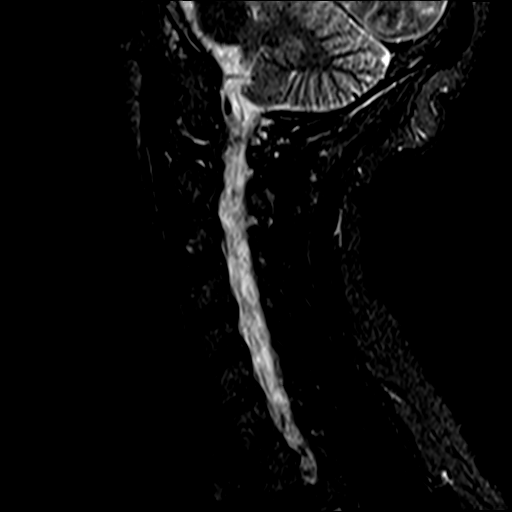
[im 12/15]
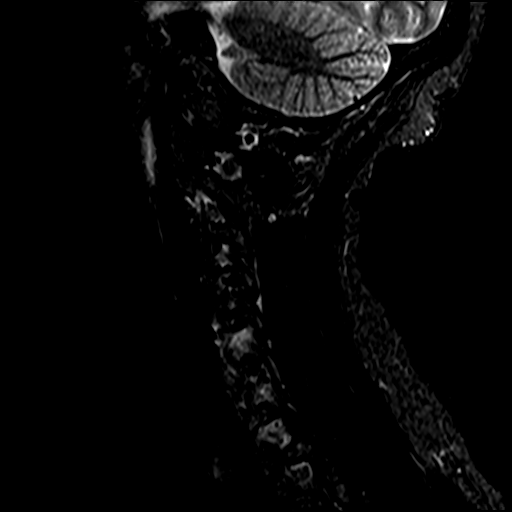
[im 15/15]
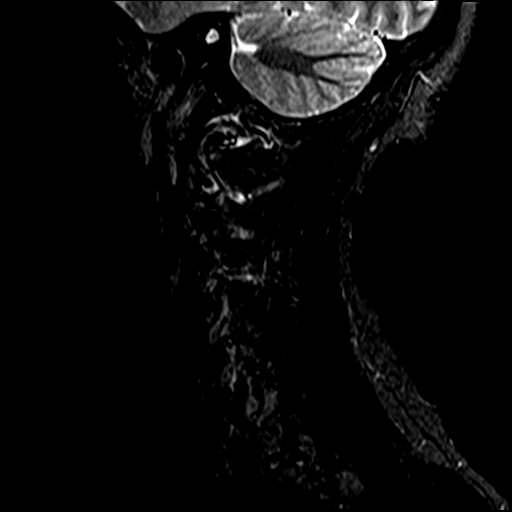

[Series 5: T2 · axial · 3.0mm · 0.70mm/px · z∈[-86,+7]mm · 9 of 26 slices shown (2 of 2)]
[im 1/26]
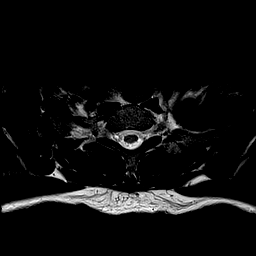
[im 5/26]
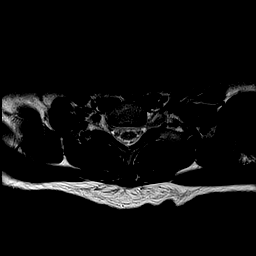
[im 9/26]
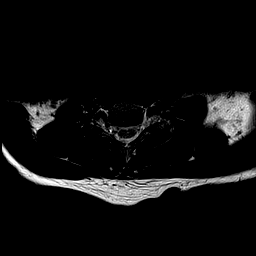
[im 11/26]
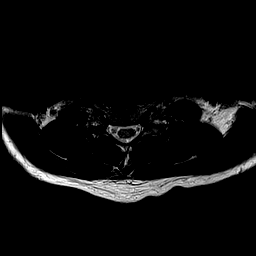
[im 13/26]
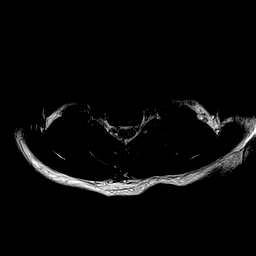
[im 15/26]
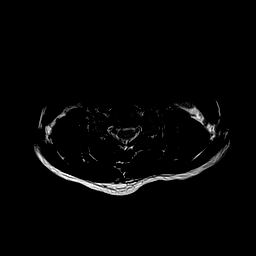
[im 17/26]
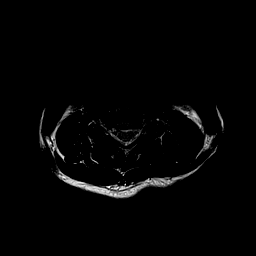
[im 21/26]
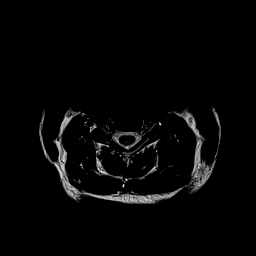
[im 26/26]
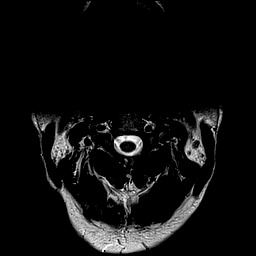

[Series 6: mpgr ax · axial · 3.0mm · 0.35mm/px · z∈[-86,-34]mm · 5 of 26 slices shown]
[im 1/26]
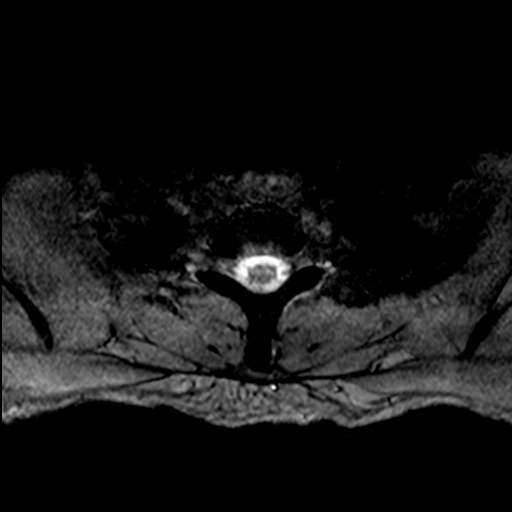
[im 5/26]
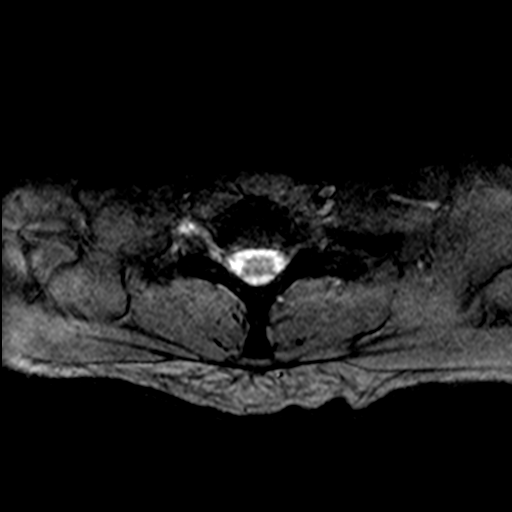
[im 9/26]
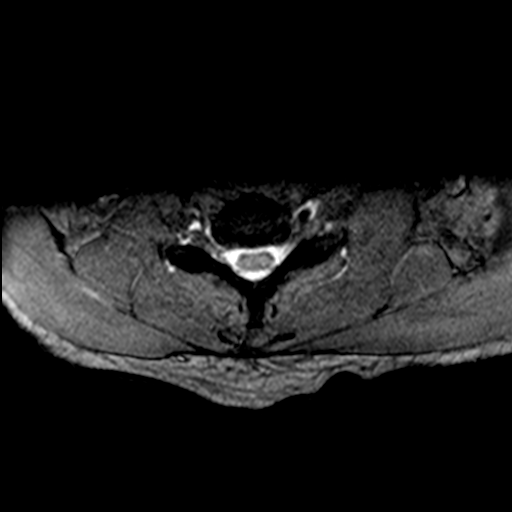
[im 11/26]
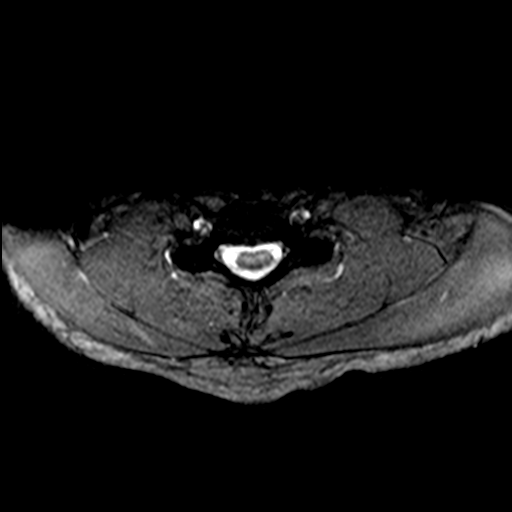
[im 15/26]
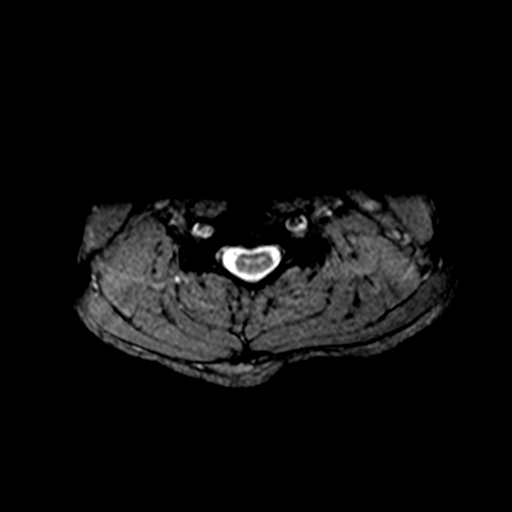

[Series 7: T1 · sagittal · 3.0mm · 0.70mm/px · 7 of 15 slices shown]
[im 1/15]
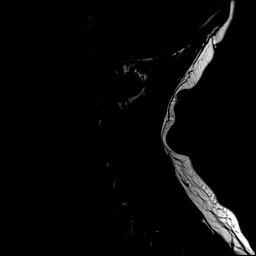
[im 3/15]
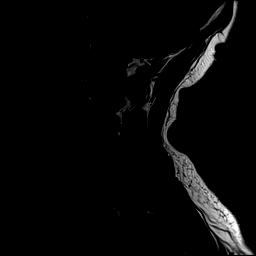
[im 5/15]
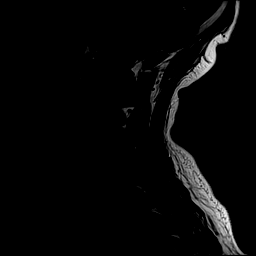
[im 8/15]
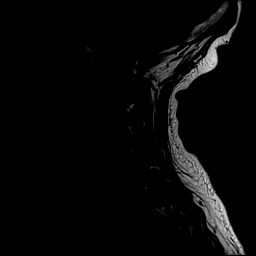
[im 10/15]
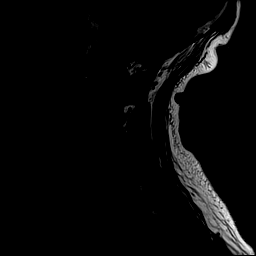
[im 12/15]
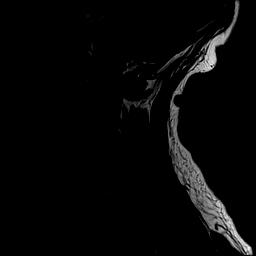
[im 15/15]
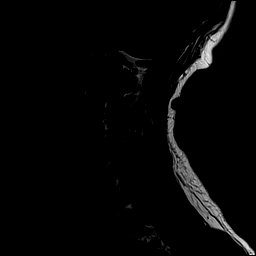

[36 of 48 positions shown; findings below may reference images not displayed]

FINDINGS: The visualized intracranial contents, cervical spinal cord and
paraspinal soft tissues are normal. There is no mass lesion or
myelopathy of the cervical spinal cord.

There is no facet arthritis in the cervical spine. No significant
foraminal stenosis in the cervical spine.

Craniocervical junction through C3-4:  Normal.

C4-5: New minimal broad-based disc bulge with no neural impingement.

C5-6:  Normal.

C6-7: Tiny broad-based disc bulge with no neural impingement,
stable.

C7-T1 and T1-2:  Normal.
IMPRESSION: Minimal degenerative disc disease at C4-5 and C6-7. No neural
impingement. No facet arthritis.
